# Patient Record
Sex: Female | Born: 1935
Health system: Southern US, Community
[De-identification: ages and names within clinical notes are randomized; demographics above are authoritative.]

## PROBLEM LIST (undated history)

## (undated) DIAGNOSIS — J302 Other seasonal allergic rhinitis: Secondary | ICD-10-CM

## (undated) DIAGNOSIS — G459 Transient cerebral ischemic attack, unspecified: Secondary | ICD-10-CM

## (undated) DIAGNOSIS — J449 Chronic obstructive pulmonary disease, unspecified: Secondary | ICD-10-CM

## (undated) DIAGNOSIS — I10 Essential (primary) hypertension: Secondary | ICD-10-CM

## (undated) HISTORY — PX: NO PAST SURGERIES: SHX2092

---

## 1999-11-25 ENCOUNTER — Inpatient Hospital Stay (HOSPITAL_COMMUNITY): Admission: EM | Admit: 1999-11-25 | Discharge: 1999-11-28 | Payer: Self-pay | Admitting: Emergency Medicine

## 1999-11-25 ENCOUNTER — Encounter: Payer: Self-pay | Admitting: Neurology

## 1999-11-25 ENCOUNTER — Encounter: Payer: Self-pay | Admitting: Emergency Medicine

## 2002-03-05 ENCOUNTER — Ambulatory Visit (HOSPITAL_COMMUNITY): Admission: RE | Admit: 2002-03-05 | Discharge: 2002-03-05 | Payer: Self-pay | Admitting: Family Medicine

## 2002-03-05 ENCOUNTER — Encounter: Payer: Self-pay | Admitting: Family Medicine

## 2002-06-03 ENCOUNTER — Other Ambulatory Visit: Admission: RE | Admit: 2002-06-03 | Discharge: 2002-06-03 | Payer: Self-pay | Admitting: Family Medicine

## 2002-08-01 ENCOUNTER — Other Ambulatory Visit: Admission: RE | Admit: 2002-08-01 | Discharge: 2002-08-01 | Payer: Self-pay | Admitting: Family Medicine

## 2005-08-02 ENCOUNTER — Inpatient Hospital Stay (HOSPITAL_COMMUNITY): Admission: AD | Admit: 2005-08-02 | Discharge: 2005-08-04 | Payer: Self-pay | Admitting: Internal Medicine

## 2005-08-02 ENCOUNTER — Encounter (INDEPENDENT_AMBULATORY_CARE_PROVIDER_SITE_OTHER): Payer: Self-pay | Admitting: *Deleted

## 2005-08-02 ENCOUNTER — Encounter: Payer: Self-pay | Admitting: *Deleted

## 2008-12-16 ENCOUNTER — Emergency Department (HOSPITAL_COMMUNITY): Admission: EM | Admit: 2008-12-16 | Discharge: 2008-12-17 | Payer: Self-pay | Admitting: Emergency Medicine

## 2008-12-17 ENCOUNTER — Observation Stay (HOSPITAL_COMMUNITY): Admission: EM | Admit: 2008-12-17 | Discharge: 2008-12-18 | Payer: Self-pay | Admitting: Emergency Medicine

## 2008-12-18 ENCOUNTER — Encounter: Payer: Self-pay | Admitting: Pulmonary Disease

## 2008-12-23 ENCOUNTER — Encounter: Payer: Self-pay | Admitting: Pulmonary Disease

## 2008-12-23 ENCOUNTER — Ambulatory Visit: Payer: Self-pay | Admitting: Pulmonary Disease

## 2008-12-23 ENCOUNTER — Telehealth: Payer: Self-pay | Admitting: Pulmonary Disease

## 2008-12-23 DIAGNOSIS — I10 Essential (primary) hypertension: Secondary | ICD-10-CM

## 2008-12-23 DIAGNOSIS — J449 Chronic obstructive pulmonary disease, unspecified: Secondary | ICD-10-CM

## 2008-12-23 DIAGNOSIS — R0602 Shortness of breath: Secondary | ICD-10-CM

## 2008-12-23 DIAGNOSIS — J4489 Other specified chronic obstructive pulmonary disease: Secondary | ICD-10-CM | POA: Insufficient documentation

## 2008-12-25 ENCOUNTER — Telehealth (INDEPENDENT_AMBULATORY_CARE_PROVIDER_SITE_OTHER): Payer: Self-pay | Admitting: *Deleted

## 2009-01-14 ENCOUNTER — Ambulatory Visit: Payer: Self-pay | Admitting: Pulmonary Disease

## 2010-11-22 LAB — PROTIME-INR: INR: 1 (ref 0.00–1.49)

## 2010-11-22 LAB — POCT CARDIAC MARKERS
Troponin i, poc: <0.05 ng/mL (ref 0.00–0.09)
Troponin i, poc: <0.05 ng/mL (ref 0.00–0.09)

## 2010-11-22 LAB — DIFFERENTIAL
Basophils Absolute: 0.1 10*3/uL (ref 0.0–0.1)
Basophils Relative: 1 % (ref 0–1)
Eosinophils Relative: 1 % (ref 0–5)
Lymphocytes Relative: 6 % — ABNORMAL LOW (ref 12–46)
Lymphs Abs: 0.5 10*3/uL — ABNORMAL LOW (ref 0.7–4.0)
Monocytes Absolute: 0.3 10*3/uL (ref 0.1–1.0)
Monocytes Absolute: 0.5 10*3/uL (ref 0.1–1.0)
Monocytes Relative: 5 % (ref 3–12)
Neutro Abs: 5.9 10*3/uL (ref 1.7–7.7)
Neutro Abs: 6.3 10*3/uL (ref 1.7–7.7)
Neutrophils Relative %: 74 % (ref 43–77)

## 2010-11-22 LAB — POCT I-STAT, CHEM 8
Calcium, Ion: 1 mmol/L — ABNORMAL LOW (ref 1.12–1.32)
Calcium, Ion: 1.13 mmol/L (ref 1.12–1.32)
Chloride: 102 mEq/L (ref 96–112)
Chloride: 103 mEq/L (ref 96–112)
Creatinine, Ser: 0.8 mg/dL (ref 0.4–1.2)
Glucose, Bld: 130 mg/dL — ABNORMAL HIGH (ref 70–99)
HCT: 49 % — ABNORMAL HIGH (ref 36.0–46.0)
HCT: 51 % — ABNORMAL HIGH (ref 36.0–46.0)
Hemoglobin: 17.3 g/dL — ABNORMAL HIGH (ref 12.0–15.0)
Potassium: 3.4 mEq/L — ABNORMAL LOW (ref 3.5–5.1)

## 2010-11-22 LAB — CBC
HCT: 42.5 % (ref 36.0–46.0)
Hemoglobin: 14.5 g/dL (ref 12.0–15.0)
MCHC: 34.7 g/dL (ref 30.0–36.0)
RBC: 4.8 MIL/uL (ref 3.87–5.11)
RDW: 13.8 % (ref 11.5–15.5)
WBC: 7.2 10*3/uL (ref 4.0–10.5)

## 2010-11-22 LAB — APTT: aPTT: 31 seconds (ref 24–37)

## 2010-12-27 NOTE — H&P (Signed)
NAMELILLIANAH, SWARTZENTRUBER                ACCOUNT NO.:  [id number]   MEDICAL RECORD NO.:  [id number]          PATIENT TYPE:  INP   LOCATION:  6731                         FACILITY:  [facility]   PHYSICIAN:  Lonia Blood, M.D.       DATE OF BIRTH:  08/17/1931   DATE OF ADMISSION:  12/17/2008  DATE OF DISCHARGE:                              HISTORY & PHYSICAL   PRIMARY CARE PHYSICIAN:  Dr. August Saucer L. Clovis Riley.   CHIEF COMPLAINT:  Shortness of breath.   HISTORY OF PRESENT ILLNESS:  Ms. Thunder is a 77 year old woman with  history of hypertension who reports that she came in the emergency room  today after she started having difficulty breathing and felt like she  was suffocating at home.  She feels that the source of her problems is  the exposure to the pesticide that has been sprayed on the fields around  her house.  She gives a childhood history of a hay fever, and throughout  her life, she reports being very sensitive to perfumes or chemicals  which usually cause her to become short of breath.  She has never smoked  cigarettes and does not carry a diagnosis of COPD or asthma.  The  patient presented to the emergency room the night before and it was  thought that she was suffering from pneumonia, and she received an  antibiotic.  She went home and took the antibiotic and took Coreg for  hypertension and promptly got more short of breath and required to come  back to the hospital.  In the emergency room, the patient received  intravenous labetalol and became hypotensive and felt really weak.  After a bolus of intravenous fluids, she recovered and currently, she  does not feel short of breath or does not report cough or sputum  production.  The patient has never had pulmonary function testing that  she can remember.  Ms. 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   PAST MEDICAL HISTORY:  White coat  hypertension.   HOME MEDICATIONS:  The patient has a few blood pressure medication that  she stores in the house and uses these on a p.r.n. basis based on which  one she feels will give her less side effects.  The last one she has  been prescribed and encouraged to use on a daily basis, was diltiazem,  but last night, the patient used Coreg.  The patient also takes aspirin  every day.   SOCIAL HISTORY:  The patient [place]  She never  smoked cigarettes, does not drink alcohol, does not use any illicit  drugs.   FAMILY HISTORY:  The patient has a son with asthma.   REVIEW OF SYSTEMS:  As per HPI.  Also, the patient reports some  occasional urticaria, some joint pains.  No focal weakness or numbness.  No abdominal pain, nausea, or vomiting.  All other systems reviewed and  are per HPI.  PHYSICAL EXAMINATION:  VITAL SIGNS:  On admission, temperature 97.7;  heart rate 97; respiratory rate 18; blood pressure initially 202/99,  subsequently 89/47, and currently 131/59.  GENERAL:  The patient is alert, oriented, in no acute distress, speaks  in full sentence without obvious dyspnea.  HEENT:  The patient's head seems normocephalic, atraumatic.  Eyes,  pupils equal, round, and reactive to light and accommodation.  Extraocular movements intact.  Throat clear.  NECK:  Supple.  No JVD.  CHEST:  Barrel shaped without audible wheezes or rhonchi.  The patient  has prolonged expiration when she tries to exhale deeply.  HEART:  Regular without murmurs, rubs, or gallops.  ABDOMEN:  Soft, nontender, bowel sounds are present.  LOWER EXTREMITIES:  Without edema.  SKIN:  Warm, dry without any suspicious-looking rashes.  NEUROLOGIC:  Cranial nerves II through XII intact.  Strength intact.   LABORATORY VALUES:  At the time of admission, white blood cell count  7.2, hemoglobin 14.5, platelet count is 150.  Chest x-ray shows COPD.  Ventilation-perfusion scan has intermediate  probability.   ASSESSMENT AND PLAN:  This is a 77 year old woman coming in with  probably mild chronic obstructive pulmonary disease exacerbation.  It  seems that upon removal of the patient from the area where the trigger  was in, her respiratory symptoms have improved.  Ms. Boardman is very  apprehensive about using steroids or bronchodilators.  Our plan is to  observe Ms. Mccallum in-house.  If she gets symptomatic during her  hospital stay, we will initiate treatment with Albuterol MDIs and  steroids.  Otherwise, I will perform pulmonary function testing in the  morning and decide on the treatment based on the results of those.  Ms.  Goodgame had a positive D-dimer, and she underwent ventilation-perfusion  scan, which was reported intermediate probability.  Now based on the  patient's history and probable emphysema/chronic obstructive pulmonary  disease, I do not feel that she warrants anticoagulation at this point  in time.  Her oxygen saturation has been 96% without oxygen.  We will  closely monitor the patient.  If she becomes more symptomatic, we could  consider performing a CT scan of the chest with intravenous contrast to  further investigate possibility of pulmonary emboli.      Lonia Blood, M.D.  Electronically Signed     SL/MEDQ  D:  12/17/2008  T:  12/18/2008  Job:  [id number]   cc:   L. Lupe Carney, M.D.

## 2010-12-30 NOTE — H&P (Signed)
NAMEERABELLA, KUIPERS                ACCOUNT NO.:  [id number]   MEDICAL RECORD NO.:  [id number]          PATIENT TYPE:  EMS   LOCATION:  ED                           FACILITY:  [facility]   PHYSICIAN:  Kela Millin, M.D.DATE OF BIRTH:  [birth year]   DATE OF ADMISSION:  08/02/2005  DATE OF DISCHARGE:                                HISTORY & PHYSICAL   PRIMARY CARE PHYSICIAN:  Dr. Clovis Riley.   CHIEF COMPLAINT:  Dizziness and right sided numbness.   HISTORY OF PRESENT ILLNESS:  The patient is a 77 year old white female with  past medical history significant for TIA, hypertension, who presents with  above complaints.  The patient states that she was in her usual state of  health until about 8 p.m. on the night prior to presentation, when she began  feeling dizzy while lying down.  She states that she just felt very sick  like she could fall, and she is unsure about spinning because she has been  keeping her eyes closed.  Shortly thereafter, she also became nauseated and  vomited.  She admits to right sided numbness excluding her face.  She denies  weakness.  The patient also denies palpitations, chest pain, dysphagia,  headaches.  Per family, the patient did not have any slurred speech or  facial droop.  She also denies fevers, cough, dysuria, melena, hematochezia.   The patient was seen in the ER and a CT scan showed a question mark area of  lacunar infarct but a followup MRI showed no acute intracranial findings  noting that there was a remote lacunar infarct.  The patient is admitted to  the [facility] service for further evaluation and management.   PAST MEDICAL HISTORY:  As stated above.   MEDICATIONS:  Micardis 40 mg p.o. every day.   ALLERGIES:  1.  ASPIRIN.  2.  SPORANOX.  3.  TETANUS TOXOID.   SOCIAL HISTORY:  She denies tobacco, also denies alcohol.   FAMILY HISTORY:  Noncontributory.   PHYSICAL EXAMINATION:  GENERAL:  The patient is an elderly white female,  keeping eyes closed throughout the interview, in no respiratory distress.  VITAL SIGNS:  Her temperature is 96.5, blood pressure 135/80 was initially  181/97, pulse 91, respiratory rate 16, O2 sat 96%.  HEENT:  PERRL.  EOMI.  Moist mucous membranes.  No oral exudates.  No facial  asymmetry.  NECK:  Supple.  No adenopathy.  No carotid bruits appreciated.  No  thyromegaly.  LUNGS:  Clear to auscultation bilaterally.  No crackles or wheezes.  CARDIOVASCULAR:  Regular rate and rhythm.  Normal S1 S2.  ABDOMEN:  Soft.  Bowel sounds present.  Nontender, nondistended.  No  organomegaly.  EXTREMITIES:  No cyanosis and no edema.  NEUROLOGIC:  Awake, oriented x3.  Cranial nerves II-XII grossly intact.  Strength 4 to 5 out of 5 and symmetric.  Sensory grossly intact.  No  pronator drift.   LABORATORY DATA:  The CT scan of the head question mark area of lacunar  infarct.  MRI with no acute intracranial findings, chronic microvascular  ischemic  white matter changes.  Remote lacunar infarct.  A chest x-ray with  mild bronchitic changes.   The white cell count is 9.5, hemoglobin is 15.4, hematocrit is 45.4%,  platelet count 181, neutrophil count is 92%.  Sodium 133, potassium 3.6,  chloride 104, CO2 is 25, glucose 169, BUN 12, creatinine 0.7.  LFTs are  within normal limits.  Urinalysis is negative for infection and the INR is  0.9.   ASSESSMENT/PLAN:  1.  Question vertigo, probable transient ischemic attack.  The patient also      with right sided numbness and paresthesias as already discussed and a      previous history of transient ischemic attack and hypertension.  MRI as      above with no acute intracranial findings.  We will start the patient on      Plavix, noted patient allergic to aspirin.  We will obtain carotid      Doppler studies and also a 2D echocardiogram.  Symptomatic relief with      Antivert and antiemetics as appropriate.  Follow and consult neurology,      the patient seen in  the past by Dr. Anne Hahn.  We will also obtain a      fasting lipid profile.  2.  Hypertension.  Monitor blood pressure.  Continue Micardis.           ______________________________  Kela Millin, M.D.     ACV/MEDQ  D:  08/02/2005  T:  08/02/2005  Job:  [id number]   cc:   Marlan Palau, M.D.  Fax: [phone]   Clovis Riley, Dr.  [facility]

## 2010-12-30 NOTE — Discharge Summary (Signed)
[place]. [facility]  Patient:    Erica Arnold, Erica Arnold                      MRN: [id number] Adm. Date:  11/25/99 Disc. Date: 11/28/99 Attending:  Marlan Palau, M.D. CC:         [facility]                           Discharge Summary  ADMITTING DIAGNOSIS:  Probable transient ischemic attack event with left hemisensory deficit.  DISCHARGE DIAGNOSES: 1. Probable transient ischemic attack with left hemisensory deficit. 2. History of hypertension, untreated.  PROCEDURES: 1. MRI scan of the brain. 2. MRI angiogram. 3. Two-dimensional echocardiogram. 4. Carotid Doppler study. 5. CT scan of the brain.  COMPLICATIONS OF ABOVE PROCEDURES:  None.  HISTORY OF PRESENT ILLNESS:  Erica Arnold is a 77 year old right-handed white female born Jul 15, 1932, with a history that is relatively unremarkable.  This patient was brought into the emergency room at [facility] with three separate episodes of left-sided hemisensory deficit.  The first event occurred about 3 oclock p.m. on the day of admission with some numbness of the left hand, forearm, and left face lasting about 15 minutes. 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  Patient had two other episodes later that day involving the left face, arm, and leg with numbness and feeling "weak" all over.  Patient was brought to the hospital for an evaluation.  A CT scan of the brain in the emergency room was unremarkable.  Patient was admitted for evaluation of a probable TIA event.  PAST MEDICAL HISTORY: 1. Left-sided hemisensory deficit x 3 on the day of admission. 2. Possible hypertension is untreated.  Patient was on no medications prior to    admission.  ALLERGIES:  She states an allergy/intolerance to ASPIRIN.  She is allergic to [facility] which causes a rash.  Please refer to history and physical dictation  summary for social history, family history, review of systems, and physical examination.  LABORATORY VALUES:  White count 8.5, hemoglobin 15.3, hematocrit 42.5, MCV 85.1, platelets 166, sed rate 8, INR 1.0.  Sodium 143, potassium 3.8, chloride 108, glucose 117, BUN 12, creatinine 0.5.  Homocysteine level 6.4 which is normal.  EKG reveals normal sinus rhythm with sinus tachycardia, heart rate of 106.  Chest x-ray shows chronic lung disease, no acute abnormality.  HOSPITAL COURSE:  Patient has done well during the course of hospitalization. Patient was admitted to the hospital and IV heparin was started.  Patient was noted to have a significant problem with hypertension with initial blood pressure of 190/101, heart rate of 106.  Patient was not initially treated with antihypertensive medications but heparin again was started.   The patient was set up for a carotid Doppler study that shows evidence of a right internal carotid artery stenosis of 40 to 60% with likelihood of the low range being present due to tortuosity of the vessel, no internal carotid artery stenosis noted on the left.  Antegrade vertebral artery flow was noted.  A 2D echocardiogram was performed showing normal left ventricular size and ejection fraction.  Left and right atria were of normal size.  Right ventricle was of normal size.  Aortic root was normal.  Pulmonary artery was normal.  No pericardial effusion was noted.  Mild aortic  sclerosis was present.  Mild mitral annular calcification was present.  Patient underwent an MRI scan of the brain showing mild small vessel disease.  MRI angiogram shows no acute findings.  Patient has mild atherosclerotic changes with a proximal internal carotid artery in the right, a diminished signal in the right internal carotid artery, and the left was present likely due to artifact, not actual.  Patient was taken off of heparin and was discharged to home, taking baby aspirin a day.   Patient was to follow up with Dr. Anne Hahn within three weeks following discharge.  At the time of discharge, patient was bright, alert, cooperative, no focal neurologic deficits noted, fully ambulatory.  Blood pressures during this hospitalization remained somewhat borderline ranging from 150 to 175 systolic and around 75 to 100 diastolic.  These blood pressures are to be followed and may require medical management in the future.  Low-salt diet is recommended. DD:  12/15/99 TD:  12/17/99 Job: [id number]

## 2010-12-30 NOTE — Consult Note (Signed)
Erica Arnold, Erica Arnold                ACCOUNT NO.:  [id number]   MEDICAL RECORD NO.:  [id number]          PATIENT TYPE:  INP   LOCATION:  3713                         FACILITY:  [facility]   PHYSICIAN:  Deanna Artis. Hickling, M.D.DATE OF BIRTH:  [birth year]   DATE OF CONSULTATION:  DATE OF DISCHARGE:  08/04/2005                                   CONSULTATION   SUMMARIZATION OF HOSPITALIZATION:  The patient is a 77 year old woman who  presented with onset of severe vertigo, nausea, and right-sided weakness.   The patient was admitted to the hospital by the Hospitalist Service.  The  decision was that the patient had a stroke despite the fact that the initial  MRI scan was negative.   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   Right vertebral artery was dominant.  There is significant intracranial  atherosclerotic disease in the posterior circulation otherwise no  significant carotid disease.   Carotid Doppler showed no hemodynamically significant stenosis with mild  plaque throughout the common carotid bifurcation internal carotid arteries  bilateral antegrade flow, there was evidence of right internal carotid  artery stenosis.   2D echocardiogram showed left ventricular ejection fraction of 65-75%, the  study was inadequate to determine regional wall motion, there was no  significant valvular disease, there was no source of emboli.  The patient  passed her swallowing study on my request.   Serum homocystine elevated at 16.7, total cholesterol 241, HDL 37, VLDL 47,  LDL 159, triglycerides 229, hemoglobin A1c 5.6.   Her examination today -- the patient was awake and alert, cranial nerves  were intact.  She had some left lateral gaze nystagmus, but I did not see  that in the central position.  She  had symmetric facial strength, no sensory  loss or field cut.  Motor examination showed excellent strength, normal  finger-to-nose and fine motor movements.  Sensation no hemisensory,  cerebellar no hemi dystaxia.  Reflexes were diminished.  Gait was normal.   The patient is being discharged on:  1.  Enteric-coated aspirin 81 mg daily.  She says that she cannot tolerate      higher doses than that.  2.  Also on Metanx one per day.  3.  Lipitor 20 mg per day.   Her home medications include:  1.  Micardis 40 mg in the afternoon.  2.  Protonix 40 mg per day.  3.  Plavix 75 mg per day.   The patient is discharged in improved condition.  She will follow-up in the  neurology office with myself, with Dr. Jacki Cones, and our nurse  practitioner, Annie Main in two months time.      Deanna Artis. Sharene Skeans, M.D.  Electronically Signed     [facility]/MEDQ  D:  08/04/2005  T:  08/07/2005  Job:  [id number]   cc:   Pramod P. Pearlean Brownie, MD  Fax: [phone]

## 2010-12-30 NOTE — H&P (Signed)
[place]. [facility]  Patient:    Erica Arnold, Erica Arnold                       MRN: [id number] Adm. Date:  [id number] Attending:  Lesly Dukes CC:         [facility], [address]                         History and Physical  HISTORY OF PRESENT ILLNESS:  Donta Mcinroy. Ishibashi is a 77 year old, right-handed, white female born 04-10-1932, who has essentially a negative past medical history. The patient comes to the [facility] Emergency Room following three separate episodes of left-sided hemisensory deficit.  The first event occurred around 3 oclock this afternoon, associated with some numbness of the left hand and forearm and left face, lasting about 15 minutes.  The patient thought initially  that this was secondary to herbicide they were putting down on tobacco fields today.  The patient noted clearing initially, but then began having similar problems again and felt as if both feet had gotten numb, and left leg and left rm were numb.  This once again cleared.  The patient took a shower and laid down to rest and did well.  Two hours later, the patient again noted left face, arm, and leg numbness.  The patient felt "weak all over" and noted that it was a bit more difficult to speak than usual.  The patient claims this episode lasted about 30  minutes.  The patient did note full clearing about the time she got to the emergency room.  The patient denies any visual field changes, denies headache, blackout episodes, focal weakness per se.  The patient is coming to the hospital at this point for evaluation of possible TIA event.  CT scan of the brain done through the emergency room was unremarkable.  PAST MEDICAL HISTORY:  1. Episodes of left hemisensory deficit x 3 on the day of admission.  2. Possible hypertension, untreated.  MEDICATIONS:  The patient is on no medications.  ALLERGIES:  States intolerance to ASPIRIN and  allergic to [facility], which causes a skin rash.  SOCIAL HISTORY:  The patient is married.  Lives in the [place] area.  Has three children who are alive and well.  FAMILY MEDICAL HISTORY:  Notable that the mother has died with a stroke or pulmonary embolus.  Father died with cancer of the prostate.  The patient had six brothers, three sisters.  One brother died following a motor vehicle accident. One sister died with a parathyroid problem, hypertension, MI.  Sister died age 50 with sepsis.  Another sister died with stroke.  The patient has a brother with heart  disease, a brother with diabetes.  A brother died with polycythemia and stroke, and brother with heart disease died.  REVIEW OF SYSTEMS:  Notable for no fever or chills.  The patient denies headache, shortness of breath.  Denies chest pains.  Denies problems controlling the bowels or the bladder.  Denies any prior history of stroke, blackout episodes, dizziness.  PHYSICAL EXAMINATION:  VITAL SIGNS:  Blood pressure initially is 190/101, heart rate 106.  GENERAL:  This patient is a minimally obese white female who is alert and cooperative at the time of examination.  HEENT:  Head is atraumatic.  Eyes:  Pupils are equal, round, and react to light. Disks:  Flat bilaterally.  Extraocular movements are full.  NECK:  Supple.  No carotid bruits noted.  RESPIRATORY:  Clear to auscultation and percussion.  CARDIOVASCULAR:  Regular rate and rhythm without obvious murmurs or rubs.  ABDOMEN:  Positive bowel sounds.  No organomegaly or tenderness is noted.  EXTREMITIES:  Without significant edema.  NEUROLOGIC:  Cranial nerves as above.  Facial asymmetry is present.  The patient has a good sensation of face to pinprick and soft touch bilaterally.  The patient has good strength of facial muscles and the muscles of head turning, shoulder shrug bilaterally.  Speech is well enunciated and not aphasic.  Again, visual  fields re full.  Motor testing reveals 5/5 strength in all fours.  Good symmetric motor tones noted throughout.  Sensory testing is intact to pinprick, soft touch, vibratory  sensation throughout.  Finger-to-nose-to-finger, toe-to-finger symmetric, normal. Gait was not tested.  Deep tendon reflexes are symmetric and normal.  Toes are downgoing bilaterally.  LABORATORY VALUES:  Notable for glucose of 117, BUN 12, sodium 143, potassium 3.8, chloride 108, CO2 28.  Hematocrit 45, hemoglobin 15, pH 7.432, creatinine 0.5.  IMPRESSION:  Probable transient ischemic attack event, left hemisensory deficit.  This patient has undergone a CT scan of the brain.  It is unremarkable.  The patient has had three separate episodes of left-sided numbness.  At least one event may have been associated with numbness in both feet.  Need to rule out posterior circulation abnormality or right brain ischemia.  The patient appeared to be neurologically unstable at this point.  PLAN:  1. Would admit patient to unit 3000 for an evaluation of TIA.  2. IV heparin therapy.  3. MRI scan of the brain.  4. MR angiogram.  5. A 2-D echocardiogram.  6. Carotid Doppler study.  7. Will follow clinical course while in house. DD:  11/25/99 TD:  11/25/99 Job: [id number]

## 2010-12-30 NOTE — Consult Note (Signed)
NAMEAMIA, RYNDERS                ACCOUNT NO.:  [id number]   MEDICAL RECORD NO.:  [id number]          PATIENT TYPE:  EMS   LOCATION:  ED                           FACILITY:  [facility]   PHYSICIAN:  Melvyn Novas, M.D.  DATE OF BIRTH:  09/08/1931   DATE OF CONSULTATION:  08/02/2005  DATE OF DISCHARGE:                                   CONSULTATION   Admitted to [facility] ER on August 02, 2005 to the service of  Dr. Donnalee Curry, hospitalist.  Consult called in by Dr. Suanne Marker.   CHIEF COMPLAINT:  Vertigo and right sided numbness.   HISTORY OF PRESENT ILLNESS:  Mrs. Lafuente is a 77 year old right handed  Caucasian female who presented to the ER with a chief complaint of right  sided numbness, no weakness.  She stated to Dr. Suanne Marker, after she was  evaluated this morning, that she noticed severe vertigo with nausea,  counterclockwise vertigo and the patient still felt right sided numbness  which has been a little bit decreased in comparison to the original  complaint time.  Dr. Suanne Marker called Neurology this morning at 4 a.m. and Mrs.  Cedrone states that since she has been in the ER now for 14 hours, she has  had almost resolution of her sensory findings, but her vertigo has gotten  worse.  She denies any trigger factors.  She awoke with the deficits earlier  this morning.  She has no recent illness, viral, or otherwise of nature.  No  recent medication changes.   REVIEW OF SYSTEMS:  Nausea, vertigo, hemi-numbness, balance problems,  anorexia, generalized weakness.   PAST MEDICAL HISTORY:  1.  Lacunar infarct at 2001.  Seen by Dr. Anne Hahn who had seen the patient at      that time with complaint of right sided symptoms, hemi-weakness and      numbness.  2.  History of hypertension which has been poorly controlled on Micardis.      The patient has been unaware of elevated blood glucose levels that were      found today.  3.  She is likely a diabetic type 2.   LABORATORY  DATA:  Glucosuria, elevated serum glucose, LEH.  EKG was normal.  Sodium 130, potassium 3.6, chloride 104, BUN 12, creatinine 0.7, glucose  169, normal GOT, GPT and calcium levels.  MRI shows no acute stroke.  Earlier CT showed an old lacune in the pontine region.   MEDICATIONS:  The only medication at home is Micardis 40 mg.  The patient  says she has not been on aspirin.  She has no allergy to aspirin.  She  started today here in the ER on Plavix 75 mg, Protonix 40 mg, Lovenox subcu.  She also received Antivert x2 and Zofran with little results.   ALLERGIES:  Tetanus shots.   REVIEW OF SYSTEMS:  As stated above.  Out of 15 system review, endorsed for  mainly vertigo and hemi-numbness.   PHYSICAL EXAMINATION:  VITAL SIGNS:  Blood pressure 179/97, heart rate 94,  temperature 96.5, respiratory rate 18, oxygen saturation 96.  LUNGS:  Clear to auscultation.  COR:  Regular rate and rhythm.  No bruits over the carotid.  Peripheral  pulses are positive.  EXTREMITIES:  She has a slightly modeled and puffy looking skin, but does  not show edema.  No bruising or rash.  No goiter.  No neck vein distention,  no lymph node swelling.  Upper airway shows no inflammatory changes.  She  has anicteric sclerae.  NEUROLOGICAL:  Mental status alert and oriented x3.  Discomfort is visible  when the patient opens her eyes.  She feels much better with her eyes  closed.  We could have evaluated her for severe nystagmus in either gaze  direction horizontally.  No vertical nystagmus. Facial symmetry.  No tongue  or uvula deviation.  No facial sensory loss.  The neck is supple.  Motor  examination shows 5/5 equal strength, tone and mass.  Anti-gravity  movements.  She follows motor commands without hesitation.  Deep tendon  reflexes are symmetric at 1+.  Downgoing toes to plantar stimulation.  Sensory:  The patient feels still numb in the right arm, but to fine touch,  she does not seem to feel impaired.  It  is more an alteration/ dysaesthesia  as a primary sensory loss.  Coordination:  Fingers and nose intact on the  right, but interestingly, there is mild dysmetria on the left without tremor  or ataxia.   ASSESSMENT:  This is a vertigo case.  MRI was negative for stroke.  The  patient does have multiple risk factors for cerebrovascular accident  including newly diagnosed diabetes here from the ER today, hypertension and  cholesterol levels.  We need to try to control these with risk factors  during her hospitalization.  Unable to try to suggest to treat the vertigo with valium and Thorazine.  The patient will be transferred to a telemetry bed at [facility]  under the service of Dr. Suanne Marker.  I also suggest that we do a transcranial  Doppler.      Melvyn Novas, M.D.  Electronically Signed     CD/MEDQ  D:  08/02/2005  T:  08/05/2005  Job:  [id number]

## 2011-09-19 DIAGNOSIS — I10 Essential (primary) hypertension: Secondary | ICD-10-CM | POA: Diagnosis not present

## 2011-09-29 DIAGNOSIS — J309 Allergic rhinitis, unspecified: Secondary | ICD-10-CM | POA: Diagnosis not present

## 2011-09-29 DIAGNOSIS — E7800 Pure hypercholesterolemia, unspecified: Secondary | ICD-10-CM | POA: Diagnosis not present

## 2011-09-29 DIAGNOSIS — I10 Essential (primary) hypertension: Secondary | ICD-10-CM | POA: Diagnosis not present

## 2012-03-28 DIAGNOSIS — I10 Essential (primary) hypertension: Secondary | ICD-10-CM | POA: Diagnosis not present

## 2012-03-28 DIAGNOSIS — R002 Palpitations: Secondary | ICD-10-CM | POA: Diagnosis not present

## 2012-03-28 DIAGNOSIS — E7800 Pure hypercholesterolemia, unspecified: Secondary | ICD-10-CM | POA: Diagnosis not present

## 2012-03-28 DIAGNOSIS — R5383 Other fatigue: Secondary | ICD-10-CM | POA: Diagnosis not present

## 2012-05-30 DIAGNOSIS — L570 Actinic keratosis: Secondary | ICD-10-CM | POA: Diagnosis not present

## 2012-05-30 DIAGNOSIS — Z85828 Personal history of other malignant neoplasm of skin: Secondary | ICD-10-CM | POA: Diagnosis not present

## 2012-05-30 DIAGNOSIS — D044 Carcinoma in situ of skin of scalp and neck: Secondary | ICD-10-CM | POA: Diagnosis not present

## 2012-05-30 DIAGNOSIS — D235 Other benign neoplasm of skin of trunk: Secondary | ICD-10-CM | POA: Diagnosis not present

## 2012-10-14 DIAGNOSIS — R002 Palpitations: Secondary | ICD-10-CM | POA: Diagnosis not present

## 2012-11-02 DIAGNOSIS — R002 Palpitations: Secondary | ICD-10-CM | POA: Diagnosis not present

## 2013-08-25 DIAGNOSIS — Z85828 Personal history of other malignant neoplasm of skin: Secondary | ICD-10-CM | POA: Diagnosis not present

## 2013-08-25 DIAGNOSIS — D235 Other benign neoplasm of skin of trunk: Secondary | ICD-10-CM | POA: Diagnosis not present

## 2013-08-25 DIAGNOSIS — C44319 Basal cell carcinoma of skin of other parts of face: Secondary | ICD-10-CM | POA: Diagnosis not present

## 2013-10-23 DIAGNOSIS — Z85828 Personal history of other malignant neoplasm of skin: Secondary | ICD-10-CM | POA: Diagnosis not present

## 2013-10-23 DIAGNOSIS — L259 Unspecified contact dermatitis, unspecified cause: Secondary | ICD-10-CM | POA: Diagnosis not present

## 2013-10-23 DIAGNOSIS — L570 Actinic keratosis: Secondary | ICD-10-CM | POA: Diagnosis not present

## 2014-11-13 DIAGNOSIS — R634 Abnormal weight loss: Secondary | ICD-10-CM | POA: Diagnosis not present

## 2014-11-13 DIAGNOSIS — I10 Essential (primary) hypertension: Secondary | ICD-10-CM | POA: Diagnosis not present

## 2014-11-13 DIAGNOSIS — N3000 Acute cystitis without hematuria: Secondary | ICD-10-CM | POA: Diagnosis not present

## 2014-11-13 DIAGNOSIS — R110 Nausea: Secondary | ICD-10-CM | POA: Diagnosis not present

## 2015-01-10 ENCOUNTER — Encounter (HOSPITAL_COMMUNITY): Payer: Self-pay | Admitting: Emergency Medicine

## 2015-01-10 ENCOUNTER — Emergency Department (HOSPITAL_COMMUNITY)
Admission: EM | Admit: 2015-01-10 | Discharge: 2015-01-10 | Disposition: A | Discharge status: Home Health | Payer: Medicare Other | Attending: Emergency Medicine | Admitting: Emergency Medicine

## 2015-01-10 DIAGNOSIS — Y939 Activity, unspecified: Secondary | ICD-10-CM | POA: Diagnosis not present

## 2015-01-10 DIAGNOSIS — Z8673 Personal history of transient ischemic attack (TIA), and cerebral infarction without residual deficits: Secondary | ICD-10-CM | POA: Diagnosis not present

## 2015-01-10 DIAGNOSIS — Y999 Unspecified external cause status: Secondary | ICD-10-CM | POA: Insufficient documentation

## 2015-01-10 DIAGNOSIS — S70261A Insect bite (nonvenomous), right hip, initial encounter: Secondary | ICD-10-CM | POA: Insufficient documentation

## 2015-01-10 DIAGNOSIS — W57XXXA Bitten or stung by nonvenomous insect and other nonvenomous arthropods, initial encounter: Secondary | ICD-10-CM | POA: Insufficient documentation

## 2015-01-10 DIAGNOSIS — Y929 Unspecified place or not applicable: Secondary | ICD-10-CM | POA: Insufficient documentation

## 2015-01-10 HISTORY — DX: Other seasonal allergic rhinitis: J30.2

## 2015-01-10 HISTORY — DX: Transient cerebral ischemic attack, unspecified: G45.9

## 2015-01-10 MED ORDER — CEPHALEXIN 500 MG PO CAPS: 500.0000 mg | ORAL_CAPSULE | Freq: Two times a day (BID) | ORAL | Status: DC

## 2015-01-10 NOTE — ED Notes (Signed)
Small FB removed by provider, pt tolerated well

## 2015-01-10 NOTE — ED Notes (Signed)
Pt states she noticed a tick on R hip area on 5/25.  She has only been able to remove pieces of it and says that part of it is under her skin.

## 2015-01-10 NOTE — ED Notes (Signed)
MD at bedside. 

## 2015-01-10 NOTE — ED Notes (Signed)
abx ointment and bandage applied to wound

## 2015-01-10 NOTE — ED Provider Notes (Signed)
CSN: [id number]     Arrival date & time 01/10/15  1950 History  This chart was scribed for Noland Fordyce, PA-C, working with Lacretia Leigh, MD by Marti Sleigh, ED Scribe. This patient was seen in room TR10C/TR10C and the patient's care was started at 8:52 PM.      Chief Complaint  Patient presents with  . Tick Removal   The history is provided by the patient. No language interpreter was used.    HPI Comments: Erica Arnold is a 81 y.o. female who presents to the Emergency Department complaining of a tick bite on her right hip that occurred four days ago. Pt states she was unable to fully remove the tick, and the skin around the bite has become red, scabbed and inflamed. Denies pain to the area. Pt denies nausea, vomiting, fever, or other rashes. Pt is allergic to doxycycline, states she stops breathing. Pt states she is not sure how long the tick had been attached at the point that she found, but states it was not swollen when she found it.    Past Medical History  Diagnosis Date  . Seasonal allergies   . TIA (transient ischemic attack)    History reviewed. No pertinent past surgical history. No family history on file. History  Substance Use Topics  . Smoking status: Never Smoker   . Smokeless tobacco: Not on file  . Alcohol Use: No   OB History    No data available     Review of Systems  Constitutional: Negative for fever and chills.  Gastrointestinal: Negative for nausea and vomiting.  Skin: Positive for color change and wound.    Allergies  Doxycycline  Home Medications   Prior to Admission medications   Medication Sig Start Date End Date Taking? Authorizing Provider  cephALEXin (KEFLEX) 500 MG capsule Take 1 capsule (500 mg total) by mouth 2 (two) times daily. For 10 days 01/10/15   Noland Fordyce, PA-C   BP 167/96 mmHg  Pulse 72  Temp(Src) 97.7 F (36.5 C) (Oral)  Resp 18  Ht 5\' 4"  (1.626 m)  Wt 105 lb (47.628 kg)  BMI 18.01 kg/m2  SpO2 98% Physical Exam   Constitutional: She is oriented to person, place, and time. She appears well-developed and well-nourished. No distress.  HENT:  Head: Normocephalic and atraumatic.  Eyes: Pupils are equal, round, and reactive to light.  Neck: Neck supple.  Cardiovascular: Normal rate.   Pulmonary/Chest: Effort normal. No respiratory distress.  Musculoskeletal: Normal range of motion.  Neurological: She is alert and oriented to person, place, and time. Coordination normal.  Skin: Skin is warm and dry. No rash noted. She is not diaphoretic. There is erythema.     Dime sized area of erythema and pinpoint centralized black area, likely foreign body/rest of the tick. Scant red blood. No discharge. No induration or fluctuance.  Non-tender. No surrounding erythema.   Psychiatric: She has a normal mood and affect. Her behavior is normal.  Nursing note and vitals reviewed.   ED Course  FOREIGN BODY REMOVAL Date/Time: 01/10/2015 11:02 PM Performed by: Noland Fordyce Authorized by: Noland Fordyce Consent: Verbal consent obtained. Risks and benefits: risks, benefits and alternatives were discussed Consent given by: patient Patient understanding: patient states understanding of the procedure being performed Patient consent: the patient's understanding of the procedure matches consent given Procedure consent: procedure consent matches procedure scheduled Relevant documents: relevant documents present and verified Test results: test results available and properly labeled Site marked: the operative  site was marked Required items: required blood products, implants, devices, and special equipment available Patient identity confirmed: verbally with patient and arm band Time out: Immediately prior to procedure a "time out" was called to verify the correct patient, procedure, equipment, support staff and site/side marked as required. Body area: skin General location: trunk Location details: abdomen Anesthesia  method: none. Patient sedated: no Patient restrained: no Localization method: visualized Removal mechanism: irrigation (21gtt needle) Dressing: antibiotic ointment and dressing applied Tendon involvement: none Depth: subcutaneous Complexity: simple 1 objects recovered. Objects recovered: black spec- tick vs dirt/debris Post-procedure assessment: foreign body removed Patient tolerance: Patient tolerated the procedure well with no immediate complications       DIAGNOSTIC STUDIES: Oxygen Saturation is 98% on RA, normal by my interpretation.    COORDINATION OF CARE: 8:58 PM Discussed treatment plan with pt at bedside and pt agreed to plan.  Labs Review Labs Reviewed - No data to display  Imaging Review No results found.   EKG Interpretation None      MDM   Final diagnoses:  Tick bite  Tick bite with subsequent removal of tick   Pt is an 81yo female presenting to ED with c/o a tick biting her Right hip. Pt was able to get majority of tick off over the last 4 days but still reports pinpoint area of black in center of wound made from her attempts of trying to get tick out.  Rest of tick/black spec removed w/o immediate complication. Pt states she had difficulty breathing when given doxycyline last.  Per UpToDate, no alternative for prophylaxis for lyme disease, however, due to irritation to area as pt was taking tick out of skin, will place pt on keflex to help prevent cellulitis.  Strongly encouraged to f/u with PCP in 3-4 days for wound recheck and symptom recheck. Wound care instructions provided. Return precautions provided. Pt verbalized understanding and agreement with tx plan.    I personally performed the services described in this documentation, which was scribed in my presence. The recorded information has been reviewed and is accurate.    Noland Fordyce, PA-C 01/10/15 2306  Lacretia Leigh, MD 01/12/15 [phone]

## 2016-02-14 DIAGNOSIS — E46 Unspecified protein-calorie malnutrition: Secondary | ICD-10-CM | POA: Diagnosis not present

## 2016-02-14 DIAGNOSIS — I10 Essential (primary) hypertension: Secondary | ICD-10-CM | POA: Diagnosis not present

## 2016-02-14 DIAGNOSIS — T753XXA Motion sickness, initial encounter: Secondary | ICD-10-CM | POA: Diagnosis not present

## 2016-02-14 DIAGNOSIS — R0600 Dyspnea, unspecified: Secondary | ICD-10-CM | POA: Diagnosis not present

## 2017-02-26 DIAGNOSIS — E46 Unspecified protein-calorie malnutrition: Secondary | ICD-10-CM | POA: Diagnosis not present

## 2017-02-26 DIAGNOSIS — I10 Essential (primary) hypertension: Secondary | ICD-10-CM | POA: Diagnosis not present

## 2017-02-26 DIAGNOSIS — R0600 Dyspnea, unspecified: Secondary | ICD-10-CM | POA: Diagnosis not present

## 2017-12-26 ENCOUNTER — Inpatient Hospital Stay (HOSPITAL_COMMUNITY)
Admission: EM | Admit: 2017-12-26 | Discharge: 2017-12-31 | DRG: 470 | Disposition: A | Discharge status: Skilled Nursing Facility | Payer: Medicare Other | Attending: Internal Medicine | Admitting: Internal Medicine

## 2017-12-26 ENCOUNTER — Emergency Department (HOSPITAL_COMMUNITY): Payer: Medicare Other

## 2017-12-26 ENCOUNTER — Other Ambulatory Visit: Payer: Self-pay

## 2017-12-26 ENCOUNTER — Encounter (HOSPITAL_COMMUNITY): Payer: Self-pay | Admitting: Emergency Medicine

## 2017-12-26 DIAGNOSIS — Z471 Aftercare following joint replacement surgery: Secondary | ICD-10-CM | POA: Diagnosis not present

## 2017-12-26 DIAGNOSIS — R2241 Localized swelling, mass and lump, right lower limb: Secondary | ICD-10-CM | POA: Diagnosis present

## 2017-12-26 DIAGNOSIS — D62 Acute posthemorrhagic anemia: Secondary | ICD-10-CM | POA: Diagnosis not present

## 2017-12-26 DIAGNOSIS — W010XXA Fall on same level from slipping, tripping and stumbling without subsequent striking against object, initial encounter: Secondary | ICD-10-CM | POA: Diagnosis present

## 2017-12-26 DIAGNOSIS — Z881 Allergy status to other antibiotic agents status: Secondary | ICD-10-CM | POA: Diagnosis not present

## 2017-12-26 DIAGNOSIS — I10 Essential (primary) hypertension: Secondary | ICD-10-CM | POA: Diagnosis not present

## 2017-12-26 DIAGNOSIS — S72001A Fracture of unspecified part of neck of right femur, initial encounter for closed fracture: Secondary | ICD-10-CM

## 2017-12-26 DIAGNOSIS — I119 Hypertensive heart disease without heart failure: Secondary | ICD-10-CM | POA: Diagnosis present

## 2017-12-26 DIAGNOSIS — S72091A Other fracture of head and neck of right femur, initial encounter for closed fracture: Secondary | ICD-10-CM | POA: Diagnosis not present

## 2017-12-26 DIAGNOSIS — J449 Chronic obstructive pulmonary disease, unspecified: Secondary | ICD-10-CM | POA: Diagnosis present

## 2017-12-26 DIAGNOSIS — Z66 Do not resuscitate: Secondary | ICD-10-CM | POA: Diagnosis present

## 2017-12-26 DIAGNOSIS — Z9889 Other specified postprocedural states: Secondary | ICD-10-CM

## 2017-12-26 DIAGNOSIS — R2689 Other abnormalities of gait and mobility: Secondary | ICD-10-CM | POA: Diagnosis not present

## 2017-12-26 DIAGNOSIS — D696 Thrombocytopenia, unspecified: Secondary | ICD-10-CM | POA: Diagnosis not present

## 2017-12-26 DIAGNOSIS — E875 Hyperkalemia: Secondary | ICD-10-CM | POA: Diagnosis not present

## 2017-12-26 DIAGNOSIS — Z8781 Personal history of (healed) traumatic fracture: Secondary | ICD-10-CM

## 2017-12-26 DIAGNOSIS — M2550 Pain in unspecified joint: Secondary | ICD-10-CM | POA: Diagnosis not present

## 2017-12-26 DIAGNOSIS — S72011A Unspecified intracapsular fracture of right femur, initial encounter for closed fracture: Principal | ICD-10-CM | POA: Diagnosis present

## 2017-12-26 DIAGNOSIS — Z8709 Personal history of other diseases of the respiratory system: Secondary | ICD-10-CM | POA: Diagnosis not present

## 2017-12-26 DIAGNOSIS — Z79899 Other long term (current) drug therapy: Secondary | ICD-10-CM

## 2017-12-26 DIAGNOSIS — M62838 Other muscle spasm: Secondary | ICD-10-CM | POA: Diagnosis not present

## 2017-12-26 DIAGNOSIS — Z8673 Personal history of transient ischemic attack (TIA), and cerebral infarction without residual deficits: Secondary | ICD-10-CM

## 2017-12-26 DIAGNOSIS — M898X8 Other specified disorders of bone, other site: Secondary | ICD-10-CM | POA: Diagnosis not present

## 2017-12-26 DIAGNOSIS — G459 Transient cerebral ischemic attack, unspecified: Secondary | ICD-10-CM | POA: Diagnosis not present

## 2017-12-26 DIAGNOSIS — J302 Other seasonal allergic rhinitis: Secondary | ICD-10-CM | POA: Diagnosis present

## 2017-12-26 DIAGNOSIS — Z7401 Bed confinement status: Secondary | ICD-10-CM | POA: Diagnosis not present

## 2017-12-26 DIAGNOSIS — G8911 Acute pain due to trauma: Secondary | ICD-10-CM | POA: Diagnosis not present

## 2017-12-26 DIAGNOSIS — M6281 Muscle weakness (generalized): Secondary | ICD-10-CM | POA: Diagnosis not present

## 2017-12-26 DIAGNOSIS — Z4789 Encounter for other orthopedic aftercare: Secondary | ICD-10-CM | POA: Diagnosis not present

## 2017-12-26 DIAGNOSIS — R52 Pain, unspecified: Secondary | ICD-10-CM | POA: Diagnosis not present

## 2017-12-26 DIAGNOSIS — M25551 Pain in right hip: Secondary | ICD-10-CM | POA: Diagnosis not present

## 2017-12-26 DIAGNOSIS — S299XXA Unspecified injury of thorax, initial encounter: Secondary | ICD-10-CM | POA: Diagnosis not present

## 2017-12-26 HISTORY — DX: Chronic obstructive pulmonary disease, unspecified: J44.9

## 2017-12-26 HISTORY — DX: Essential (primary) hypertension: I10

## 2017-12-26 LAB — CBC WITH DIFFERENTIAL/PLATELET
Abs Immature Granulocytes: 0.1 10*3/uL (ref 0.0–0.1)
BASOS ABS: 0.1 10*3/uL (ref 0.0–0.1)
Basophils Relative: 1 %
Eosinophils Absolute: 0 10*3/uL (ref 0.0–0.7)
Eosinophils Relative: 0 %
HEMATOCRIT: 46.8 % — AB (ref 36.0–46.0)
Hemoglobin: 15.5 g/dL — ABNORMAL HIGH (ref 12.0–15.0)
IMMATURE GRANULOCYTES: 1 %
LYMPHS ABS: 0.5 10*3/uL — AB (ref 0.7–4.0)
LYMPHS PCT: 4 %
MCH: 29.9 pg (ref 26.0–34.0)
MCHC: 33.1 g/dL (ref 30.0–36.0)
MCV: 90.3 fL (ref 78.0–100.0)
Monocytes Absolute: 0.3 10*3/uL (ref 0.1–1.0)
Monocytes Relative: 3 %
NEUTROS PCT: 91 %
Neutro Abs: 10 10*3/uL — ABNORMAL HIGH (ref 1.7–7.7)
Platelets: 124 10*3/uL — ABNORMAL LOW (ref 150–400)
RBC: 5.18 MIL/uL — AB (ref 3.87–5.11)
RDW: 12.8 % (ref 11.5–15.5)
WBC: 10.9 10*3/uL — AB (ref 4.0–10.5)

## 2017-12-26 LAB — PROTIME-INR
INR: 0.96
PROTHROMBIN TIME: 12.6 s (ref 11.4–15.2)

## 2017-12-26 LAB — TYPE AND SCREEN
ABO/RH(D): O POS
Antibody Screen: NEGATIVE

## 2017-12-26 LAB — BASIC METABOLIC PANEL
Anion gap: 11 (ref 5–15)
BUN: 16 mg/dL (ref 6–20)
CHLORIDE: 103 mmol/L (ref 101–111)
CO2: 27 mmol/L (ref 22–32)
CREATININE: 0.75 mg/dL (ref 0.44–1.00)
Calcium: 9.1 mg/dL (ref 8.9–10.3)
GFR calc non Af Amer: >60 mL/min (ref >60)
Glucose, Bld: 120 mg/dL — ABNORMAL HIGH (ref 65–99)
POTASSIUM: 3.7 mmol/L (ref 3.5–5.1)
SODIUM: 141 mmol/L (ref 135–145)

## 2017-12-26 LAB — ABO/RH: ABO/RH(D): O POS

## 2017-12-26 MED ORDER — ACETAMINOPHEN 325 MG PO TABS
650.0000 mg | ORAL_TABLET | ORAL | Status: DC | PRN
  Administered 2017-12-26 – 2017-12-27 (×2): 325 mg via ORAL
  Administered 2017-12-27: 650 mg via ORAL
  Filled 2017-12-26 (×3): qty 2

## 2017-12-26 MED ORDER — POLYETHYLENE GLYCOL 3350 17 G PO PACK: 17.0000 g | PACK | Freq: Every day | ORAL | Status: DC | PRN

## 2017-12-26 MED ORDER — CHLORHEXIDINE GLUCONATE 4 % EX LIQD
60.0000 mL | Freq: Once | CUTANEOUS | Status: AC
  Administered 2017-12-27: 4 via TOPICAL
  Filled 2017-12-26: qty 60

## 2017-12-26 MED ORDER — METHOCARBAMOL 1000 MG/10ML IJ SOLN
500.0000 mg | Freq: Four times a day (QID) | INTRAVENOUS | Status: DC | PRN
  Filled 2017-12-26 (×2): qty 5

## 2017-12-26 MED ORDER — MORPHINE SULFATE (PF) 4 MG/ML IV SOLN
4.0000 mg | Freq: Once | INTRAVENOUS | Status: AC
  Administered 2017-12-26: 4 mg via INTRAVENOUS
  Filled 2017-12-26: qty 1

## 2017-12-26 MED ORDER — METOPROLOL TARTRATE 12.5 MG HALF TABLET
12.5000 mg | ORAL_TABLET | Freq: Two times a day (BID) | ORAL | Status: DC
  Administered 2017-12-26 – 2017-12-31 (×10): 12.5 mg via ORAL
  Filled 2017-12-26 (×11): qty 1

## 2017-12-26 MED ORDER — MORPHINE SULFATE (PF) 4 MG/ML IV SOLN: 1.0000 mg | INTRAVENOUS | Status: DC | PRN

## 2017-12-26 MED ORDER — BISACODYL 5 MG PO TBEC: 5.0000 mg | DELAYED_RELEASE_TABLET | Freq: Every day | ORAL | Status: DC | PRN

## 2017-12-26 MED ORDER — SODIUM CHLORIDE 0.9 % IV SOLN
INTRAVENOUS | Status: DC
  Administered 2017-12-26 – 2017-12-27 (×2): via INTRAVENOUS

## 2017-12-26 MED ORDER — HYDROCODONE-ACETAMINOPHEN 5-325 MG PO TABS
1.0000 | ORAL_TABLET | Freq: Four times a day (QID) | ORAL | Status: DC | PRN
  Administered 2017-12-26: 1 via ORAL
  Filled 2017-12-26: qty 1

## 2017-12-26 MED ORDER — MORPHINE SULFATE (PF) 4 MG/ML IV SOLN
1.0000 mg | INTRAVENOUS | Status: DC | PRN
  Administered 2017-12-27: 2 mg via INTRAVENOUS
  Filled 2017-12-26: qty 1

## 2017-12-26 MED ORDER — POVIDONE-IODINE 10 % EX SWAB: 2.0000 "application " | Freq: Once | CUTANEOUS | Status: DC

## 2017-12-26 NOTE — ED Notes (Signed)
Ortho PA bedside discussing plan of care with pt and family

## 2017-12-26 NOTE — ED Notes (Signed)
Meal tray ordered for pt at this time.

## 2017-12-26 NOTE — ED Notes (Signed)
Per ortho, pt not able to be scheduled for surgery today, will go tomorrow

## 2017-12-26 NOTE — H&P (Addendum)
History and Physical    ARMELIA Arnold FXT:[id number] DOB: [birth year] DOA: 12/26/2017   PCP: Patient, No Pcp Per   Patient coming from:  Home    Chief Complaint: Fall with fracture   HPI: Erica Arnold is a 84 y.o. female with medical history significant for HTN, history of TIA not on ASA, COPD not on meds, brought to the emergency department today after suffering a mechanical fall at home, landing on the right side.  The patient to get up and walk since then.  She denies any loss of consciousness.  She denies any head injury or vision changes.  She denies any neck pain or trouble swallowing.  She denies any pain or weakness in her arms. When she presented, the right hip pain was 10 out of 10, that after medication administration, was reduced to about 2 out of 10.  She denies any other areas of injury.  She denies any fever or chills or recent infections.  She denies any chest pain, she has occasional palpitations, but she reports that this is due to not using her beta-blocker as instructed.  She denies any shortness of breath, or cough.  She denies any abdominal pain, nausea or vomiting. No diarrhea  She denies any significant leg swelling.  She does have external rotation of her right leg after the fall.  She denies any unilateral weakness prior to the event.  Of note, the patient states that at times, she has tingling similar to her TIA, but none over the last few weeks.  She states that when the symptoms appear, she takes an aspirin.She is a poor historian, and does not offer or elaborate further information.  She is very selective on the responses.  Tobacco, alcohol or recreational drug use.  No recent long distance travels.   ED Course:  BP (!) 155/77   Pulse 82   Temp 98.3 F (36.8 C) (Oral)   Resp 18   Wt 44.5 kg (98 lb)   SpO2 95%   BMI 16.82 kg/m   Chemistries are essentially unremarkable and CBC is remarkable for platelets, 124, and white count 10.9. EKG shows sinus rhythm,  right atrial enlargement, right axis deviation, no significant change from prior.  X-ray of the right hip shows right subcapital femoral neck fracture. Soft tissue calcifications are noted adjacent to the greater trochanter. She received morphine IV, with significant improvement of the pain control. Appendix has evaluated the patient, recommending to OR in the morning.  She will be n.p.o. after midnight.  Review of Systems:  As per HPI otherwise all other systems reviewed and are negative  Past Medical History:  Diagnosis Date  . Seasonal allergies   . TIA (transient ischemic attack)     History reviewed. No pertinent surgical history.  Social History Social History   Socioeconomic History  . Marital status: Single    Spouse name: Not on file  . Number of children: Not on file  . Years of education: Not on file  . Highest education level: Not on file  Occupational History  . Not on file  Social Needs  . Financial resource strain: Not on file  . Food insecurity:    Worry: Not on file    Inability: Not on file  . Transportation needs:    Medical: Not on file    Non-medical: Not on file  Tobacco Use  . Smoking status: Never Smoker  Substance and Sexual Activity  . Alcohol use: No  .  Drug use: No  . Sexual activity: Not on file  Lifestyle  . Physical activity:    Days per week: Not on file    Minutes per session: Not on file  . Stress: Not on file  Relationships  . Social connections:    Talks on phone: Not on file    Gets together: Not on file    Attends religious service: Not on file    Active member of club or organization: Not on file    Attends meetings of clubs or organizations: Not on file    Relationship status: Not on file  . Intimate partner violence:    Fear of current or ex partner: Not on file    Emotionally abused: Not on file    Physically abused: Not on file    Forced sexual activity: Not on file  Other Topics Concern  . Not on file  Social  History Narrative  . Not on file     Allergies  Allergen Reactions  . Doxycycline Shortness Of Breath  . Tetanus Toxoids Anaphylaxis    shock  . Other Other (See Comments)    BP medication given at [facility] caused BP to drop drastically.     History reviewed. No pertinent family history.    Prior to Admission medications   Medication Sig Start Date End Date Taking? Authorizing Provider  loratadine (CLARITIN) 10 MG tablet Take 0-10 mg by mouth daily as needed for allergies.    Yes [provider]  metoprolol tartrate (LOPRESSOR) 25 MG tablet Take 0-25 mg by mouth as needed (for mini-strokes).   Yes [provider]    Physical Exam:  Vitals:   12/26/17 0945 12/26/17 1130 12/26/17 1200 12/26/17 1230  BP:  (!) 155/85 (!) 163/83 (!) 155/77  Pulse:  83 90 82  Resp:  (!) 21 (!) 23 18  Temp:      TempSrc:      SpO2:  96% 95% 95%  Weight: 44.5 kg (98 lb)      Constitutional: NAD, calm, uncomfortable appearing, very thin, frail  eyes: PERRL, lids and conjunctivae normal .  But I wasting noted ENMT: Mucous membranes are moist, without exudate or lesions  Neck: normal, supple, no masses, no thyromegaly Respiratory: clear to auscultation bilaterally, no wheezing, no crackles. Normal respiratory effort  Cardiovascular: Regular rate and rhythm,  murmur, rubs or gallops. No extremity edema. 2+ pedal pulses. No carotid bruits.  Abdomen: Soft, non tender, No hepatosplenomegaly. Bowel sounds positive.  Musculoskeletal: no clubbing / cyanosis. Moves all extremities, except right lower extremity, which is immobilized.  The area is tender to palpation at the hip, external rotation of the leg is noted.  Distal pulses are present.  There is no significant edema. Skin: no jaundice, No lesions.  Neurologic: Sensation intact  Strength equal in all extremities Psychiatric:   Alert and oriented x 3. Normal mood.     Labs on Admission: I have personally reviewed following labs and  imaging studies  CBC: Recent Labs  Lab 12/26/17 1051  WBC 10.9*  NEUTROABS 10.0*  HGB 15.5*  HCT 46.8*  MCV 90.3  PLT 124*    Basic Metabolic Panel: Recent Labs  Lab 12/26/17 1051  NA 141  K 3.7  CL 103  CO2 27  GLUCOSE 120*  BUN 16  CREATININE 0.75  CALCIUM 9.1    GFR: CrCl cannot be calculated (Unknown ideal weight.).  Liver Function Tests: No results for input(s): AST, ALT, ALKPHOS, BILITOT,  PROT, ALBUMIN in the last 168 hours. No results for input(s): LIPASE, AMYLASE in the last 168 hours. No results for input(s): AMMONIA in the last 168 hours.  Coagulation Profile: Recent Labs  Lab 12/26/17 1051  INR 0.96  PT 12.6, PTT 31   Cardiac Enzymes: No results for input(s): CKTOTAL, CKMB, CKMBINDEX, TROPONINI in the last 168 hours.  BNP (last 3 results) No results for input(s): PROBNP in the last 8760 hours.  HbA1C: No results for input(s): HGBA1C in the last 72 hours.  CBG: No results for input(s): GLUCAP in the last 168 hours.  Lipid Profile: No results for input(s): CHOL, HDL, LDLCALC, TRIG, CHOLHDL, LDLDIRECT in the last 72 hours.  Thyroid Function Tests: No results for input(s): TSH, T4TOTAL, FREET4, T3FREE, THYROIDAB in the last 72 hours.  Anemia Panel: No results for input(s): VITAMINB12, FOLATE, FERRITIN, TIBC, IRON, RETICCTPCT in the last 72 hours.  Urine analysis: No results found for: COLORURINE, APPEARANCEUR, LABSPEC, PHURINE, GLUCOSEU, HGBUR, BILIRUBINUR, KETONESUR, PROTEINUR, UROBILINOGEN, NITRITE, LEUKOCYTESUR  Sepsis Labs: @LABRCNTIP (procalcitonin:4,lacticidven:4) )No results found for this or any previous visit (from the past 240 hour(s)).   Radiological Exams on Admission: Dg Chest 1 View  Result Date: 12/26/2017 CLINICAL DATA:  Recent fall with right hip fracture, initial encounter EXAM: CHEST  1 VIEW COMPARISON:  12/17/2008 FINDINGS: Cardiac shadow is within normal limits. Skin folds are noted bilaterally. The lungs are clear  without focal infiltrate. No acute bony abnormality is noted. IMPRESSION: No acute abnormality seen. Electronically Signed   By: Inez Catalina M.D.   On: 12/26/2017 10:49   Dg Hip Unilat With Pelvis 2-3 Views Right  Result Date: 12/26/2017 CLINICAL DATA:  Recent fall with right hip pain, initial encounter EXAM: DG HIP (WITH OR WITHOUT PELVIS) 3V RIGHT COMPARISON:  None. FINDINGS: Pelvic ring is intact. A the subcapital femoral neck fracture is identified with impaction at the fracture site. Some soft tissue calcification is noted adjacent to the intratrochanteric region. This is of uncertain chronicity. No other focal abnormality is seen IMPRESSION: Right subcapital femoral neck fracture. Soft tissue calcifications are noted adjacent to the greater trochanter. Electronically Signed   By: Inez Catalina M.D.   On: 12/26/2017 10:49    EKG: Independently reviewed. EKG shows sinus rhythm, right atrial enlargement, right 86 deviation, no significant change from prior.  Assessment/Plan Active Problems:   Essential hypertension   History of TIA (transient ischemic attack)   History of COPD   Thrombocytopenia (HCC)   Right  Subcapital femoral neck fracture CXR NAD. CBC remarkable for WBC 10.9, Afebrile  Received  IV pain meds, immobilized.  Risk of major cardiac event in surgery is 1 (0.9 %) based on all other lab findiongs    Admit to med surg, anticipating surgery as per Ortho Hip fracture order set NPOafter midnight  SCDs, platelets 124k  IVF  Pain control with Morphine and oral meds  PT/OT consult postop   Hypertension BP  155/77   Pulse 82    Resume  home anti-hypertensive medications with metoprolol    HIstory of TIA, no acute issues at this time, patient selective historian, unable to tell when last TIA sx were present. Platelets 124k  WIll continue to monitor closely  MAy consider ASA after surgery unless plts less than 70 k   COPD  (patient denies ) without exacerbation Osats normal  in RA   CXR NAD  WBC 11  NO wheezing on exam  O2 prn  Patient declines any nebs prn  at this time,  IS while awake q 4 h   Thrombocytopenia, appears acute, patient not on ASA, no significant bruising or bleeding issues at this time . Plt 124k . No infection is noted or splenomegaly on exam  No transfusion is indicated at this time Monitor counts closely Transfuse 1 unit of platelets if count is less or equal than 10,000 or 20,000 if the patient is acutely bleeding     DVT prophylaxis:  SCD  Code Status:    DNR  Family Communication:  Discussed with patient Disposition Plan: Expect patient to be discharged to home versus SNF  after condition improves Consults called:    Ortho, per EDP  Admission status: IP Medsurg    Sharene Butters, PA-C [facility]   Amion text  [phone]   12/26/2017, 12:53 PM

## 2017-12-26 NOTE — ED Notes (Signed)
Family brought pt [employer]; pt has no complaints at this time

## 2017-12-26 NOTE — Consult Note (Addendum)
Reason for Consult:Hip fx Referring Physician: K Yamilet Arnold is an 84 y.o. female.  HPI: Erica Arnold was bending down to get a pillow from behind her bed and slipped and fell, landing on her right side. She had immediate pain and could not get up. A family member came and helped but she felt like she needed to come to the ED. X-rays showed a right hip fx and orthopedic surgery was consulted. She c/o localized pain in the area but no other c/o. She does have a hard mass over the area of the break that's been there for a couple of years. It doesn't really bother her.  Past Medical History:  Diagnosis Date  . Seasonal allergies   . TIA (transient ischemic attack)     History reviewed. No pertinent surgical history.  History reviewed. No pertinent family history.  Social History:  reports that she has never smoked. She does not have any smokeless tobacco history on file. She reports that she does not drink alcohol or use drugs.  Allergies:  Allergies  Allergen Reactions  . Doxycycline     Medications: I have reviewed the patient's current medications.  Results for orders placed or performed during the hospital encounter of 12/26/17 (from the past 48 hour(s))  Basic metabolic panel     Status: Abnormal   Collection Time: 12/26/17 10:51 AM  Result Value Ref Range   Sodium 141 135 - 145 mmol/L   Potassium 3.7 3.5 - 5.1 mmol/L   Chloride 103 101 - 111 mmol/L   CO2 27 22 - 32 mmol/L   Glucose, Bld 120 (H) 65 - 99 mg/dL   BUN 16 6 - 20 mg/dL   Creatinine, Ser 0.75 0.44 - 1.00 mg/dL   Calcium 9.1 8.9 - 10.3 mg/dL   GFR calc non Af Amer >60 >60 mL/min   GFR calc Af Amer >60 >60 mL/min    Comment: (NOTE) The eGFR has been calculated using the CKD EPI equation. This calculation has not been validated in all clinical situations. eGFR's persistently <60 mL/min signify possible Chronic Kidney Disease.    Anion gap 11 5 - 15    Comment: Performed at [facility]  [address] [place], [place] [address]  CBC WITH DIFFERENTIAL     Status: Abnormal   Collection Time: 12/26/17 10:51 AM  Result Value Ref Range   WBC 10.9 (H) 4.0 - 10.5 Erica/uL   RBC 5.18 (H) 3.87 - 5.11 MIL/uL   Hemoglobin 15.5 (H) 12.0 - 15.0 g/dL   HCT 46.8 (H) 36.0 - 46.0 %   MCV 90.3 78.0 - 100.0 fL   MCH 29.9 26.0 - 34.0 pg   MCHC 33.1 30.0 - 36.0 g/dL   RDW 12.8 11.5 - 15.5 %   Platelets 124 (L) 150 - 400 Erica/uL   Neutrophils Relative % 91 %   Neutro Abs 10.0 (H) 1.7 - 7.7 Erica/uL   Lymphocytes Relative 4 %   Lymphs Abs 0.5 (L) 0.7 - 4.0 Erica/uL   Monocytes Relative 3 %   Monocytes Absolute 0.3 0.1 - 1.0 Erica/uL   Eosinophils Relative 0 %   Eosinophils Absolute 0.0 0.0 - 0.7 Erica/uL   Basophils Relative 1 %   Basophils Absolute 0.1 0.0 - 0.1 Erica/uL   Immature Granulocytes 1 %   Abs Immature Granulocytes 0.1 0.0 - 0.1 Erica/uL    Comment: Performed at [facility], [address], [place],  [address]  Protime-INR  Status: None   Collection Time: 12/26/17 10:51 AM  Result Value Ref Range   Prothrombin Time 12.6 11.4 - 15.2 seconds   INR 0.96     Comment: Performed at [facility], [place] [address], [place], [place] [address]  Type and screen [place] [facility]     Status: None   Collection Time: 12/26/17 10:54 AM  Result Value Ref Range   ABO/RH(D) O POS    Antibody Screen NEG    Sample Expiration      12/29/2017 Performed at [facility], [place] [address], [place], [place] [address]   ABO/Rh     Status: None (Preliminary result)   Collection Time: 12/26/17 10:54 AM  Result Value Ref Range   ABO/RH(D)      O POS Performed at [facility] [address] [place], [place] [address]     Dg Chest 1 View  Result Date: 12/26/2017 CLINICAL DATA:  Recent fall with right hip fracture, initial encounter EXAM: CHEST  1 VIEW COMPARISON:  12/17/2008 FINDINGS: Cardiac shadow is within normal limits. Skin folds are noted bilaterally. The lungs are clear without  focal infiltrate. No acute bony abnormality is noted. IMPRESSION: No acute abnormality seen. Electronically Signed   By: Inez Catalina M.D.   On: 12/26/2017 10:49   Dg Hip Unilat With Pelvis 2-3 Views Right  Result Date: 12/26/2017 CLINICAL DATA:  Recent fall with right hip pain, initial encounter EXAM: DG HIP (WITH OR WITHOUT PELVIS) 3V RIGHT COMPARISON:  None. FINDINGS: Pelvic ring is intact. A the subcapital femoral neck fracture is identified with impaction at the fracture site. Some soft tissue calcification is noted adjacent to the intratrochanteric region. This is of uncertain chronicity. No other focal abnormality is seen IMPRESSION: Right subcapital femoral neck fracture. Soft tissue calcifications are noted adjacent to the greater trochanter. Electronically Signed   By: Inez Catalina M.D.   On: 12/26/2017 10:49    Review of Systems  Constitutional: Negative for weight loss.  HENT: Negative for ear discharge, ear pain, hearing loss and tinnitus.   Eyes: Negative for blurred vision, double vision, photophobia and pain.  Respiratory: Negative for cough, sputum production and shortness of breath.   Cardiovascular: Negative for chest pain.  Gastrointestinal: Negative for abdominal pain, nausea and vomiting.  Genitourinary: Negative for dysuria, flank pain, frequency and urgency.  Musculoskeletal: Positive for joint pain (Right hip). Negative for back pain, falls, myalgias and neck pain.  Neurological: Negative for dizziness, tingling, sensory change, focal weakness, loss of consciousness and headaches.  Endo/Heme/Allergies: Does not bruise/bleed easily.  Psychiatric/Behavioral: Negative for depression, memory loss and substance abuse. The patient is not nervous/anxious.    Blood pressure (!) 155/85, pulse 83, temperature 98.3 F (36.8 C), temperature source Oral, resp. rate (!) 21, weight 44.5 kg (98 lb), SpO2 96 %. Physical Exam  Constitutional: She appears well-developed and  well-nourished. No distress.  HENT:  Head: Normocephalic and atraumatic.  Eyes: Conjunctivae are normal. Right eye exhibits no discharge. Left eye exhibits no discharge. No scleral icterus.  Neck: Normal range of motion.  Cardiovascular: Normal rate and regular rhythm.  Respiratory: Effort normal. No respiratory distress.  Musculoskeletal:  RLE No traumatic wounds, ecchymosis, or rash  TTP hip, mobile firm mass overlying greater trochanter  No knee or ankle effusion  Knee stable to varus/ valgus and anterior/posterior stress  Sens DPN, SPN, TN intact  Motor EHL, ext, flex, evers 5/5  DP 2+, PT 2+,  No significant edema  Neurological: She is alert.  Skin: Skin is warm and dry. She is not diaphoretic.  Psychiatric: She has a normal mood and affect. Her behavior is normal.    Assessment/Plan: Fall Right hip fx -- Will need hip hemi tomorrow morning by Dr. Mardelle Matte. NPO after MN.  COPD -- Only a problem with HTN flares or allergies HTN -- Only on prn meds  Note: Very sensitive to medications    Lisette Abu, PA-C Orthopedic Surgery [phone] 12/26/2017, 11:50 AM   Discussed and reviewed films and agree with above.  Plan for surgery tomorrow for hemiarthroplasty.  Will likely also remove hip mass and send to pathology.

## 2017-12-26 NOTE — ED Triage Notes (Signed)
Pt arrives via EMS from home with fall on carpet. Pt unable to walk since fall. Right leg shortened and rotated. Pts distal sensation, pulse intact. Pt received 20g RAC, 41mcg fentanyl PTA. Pt reports pain 10/10 moving but 2/10 at rest. Alert, oriented x4.

## 2017-12-26 NOTE — ED Provider Notes (Signed)
[facility] EMERGENCY DEPARTMENT Provider Note   CSN: [id number] Arrival date & time: 12/26/17  9233     History   Chief Complaint Chief Complaint  Patient presents with  . Fall    HPI Erica Arnold is a 84 y.o. female.  HPI A 84 year old female who presents the emergency department today after mechanical fall today at home where she landed onto her right side.  She is been unable to get up and walk since then.  EMS report leg is shortened and rotated.  Patient received 50 mics of fentanyl prior to arrival.  No head injury.  No use of anticoagulants.  Denies neck pain.  No pain or weakness in her arms.  Pain in the right hip is 10 out of 10 with movement but otherwise 2 out of 10 at this time.  No other injury.  No fevers or chills.  Denies chest pain shortness of breath.  No abdominal pain.   Past Medical History:  Diagnosis Date  . Seasonal allergies   . TIA (transient ischemic attack)     Patient Active Problem List   Diagnosis Date Noted  . HYPERTENSION 12/23/2008  . COPD 12/23/2008  . DYSPNEA 12/23/2008    History reviewed. No pertinent surgical history.   OB History   None      Home Medications    Prior to Admission medications   Medication Sig Start Date End Date Taking? Authorizing Provider  cephALEXin (KEFLEX) 500 MG capsule Take 1 capsule (500 mg total) by mouth 2 (two) times daily. For 10 days 01/10/15   Tyrell Antonio    Family History History reviewed. No pertinent family history.  Social History Social History   Tobacco Use  . Smoking status: Never Smoker  Substance Use Topics  . Alcohol use: No  . Drug use: No     Allergies   Doxycycline   Review of Systems Review of Systems  All other systems reviewed and are negative.    Physical Exam Updated Vital Signs BP (!) 175/83 (BP Location: Left Arm)   Pulse 88   Temp 98.3 F (36.8 C) (Oral)   Resp (!) 21   Wt 44.5 kg (98 lb)   SpO2 97%   BMI 16.82  kg/m   Physical Exam  Constitutional: She is oriented to person, place, and time. She appears well-developed and well-nourished. No distress.  HENT:  Head: Normocephalic and atraumatic.  Eyes: EOM are normal.  Neck: Normal range of motion. Neck supple.  Cardiovascular: Normal rate, regular rhythm and normal heart sounds.  Pulmonary/Chest: Effort normal and breath sounds normal.  Abdominal: Soft. She exhibits no distension. There is no tenderness.  Musculoskeletal:  Full range of motion of bilateral shoulders, elbows, wrist.  Full range of motion of bilateral knees and ankles.  Full range of motion of left hip.  Limited range of motion of the right hip secondary to pain.  Mild shortening and external rotation of the right lower extremity as compared to the left.  Normal pulses right foot.  Compartments of right lower extremity are soft.  Tenderness at the lateral aspect of the right hip.  Neurological: She is alert and oriented to person, place, and time.  Skin: Skin is warm and dry.  Psychiatric: She has a normal mood and affect. Judgment normal.  Nursing note and vitals reviewed.    ED Treatments / Results  Labs (all labs ordered are listed, but only abnormal results are displayed) Labs  Reviewed  BASIC METABOLIC PANEL  CBC WITH DIFFERENTIAL/PLATELET  PROTIME-INR  TYPE AND SCREEN    EKG EKG Interpretation  Date/Time:  Wednesday Dec 26 2017 09:39:43 EDT Ventricular Rate:  92 PR Interval:    QRS Duration: 84 QT Interval:  347 QTC Calculation: 430 R Axis:   95 Text Interpretation:  Sinus rhythm Right atrial enlargement Right axis deviation No significant change was found Confirmed by Jola Schmidt [phone]) on 12/26/2017 11:35:06 AM   Radiology Dg Chest 1 View  Result Date: 12/26/2017 CLINICAL DATA:  Recent fall with right hip fracture, initial encounter EXAM: CHEST  1 VIEW COMPARISON:  12/17/2008 FINDINGS: Cardiac shadow is within normal limits. Skin folds are noted  bilaterally. The lungs are clear without focal infiltrate. No acute bony abnormality is noted. IMPRESSION: No acute abnormality seen. Electronically Signed   By: Inez Catalina M.D.   On: 12/26/2017 10:49   Dg Hip Unilat With Pelvis 2-3 Views Right  Result Date: 12/26/2017 CLINICAL DATA:  Recent fall with right hip pain, initial encounter EXAM: DG HIP (WITH OR WITHOUT PELVIS) 3V RIGHT COMPARISON:  None. FINDINGS: Pelvic ring is intact. A the subcapital femoral neck fracture is identified with impaction at the fracture site. Some soft tissue calcification is noted adjacent to the intratrochanteric region. This is of uncertain chronicity. No other focal abnormality is seen IMPRESSION: Right subcapital femoral neck fracture. Soft tissue calcifications are noted adjacent to the greater trochanter. Electronically Signed   By: Inez Catalina M.D.   On: 12/26/2017 10:49    Procedures Procedures (including critical care time)  Medications Ordered in ED Medications  morphine 4 MG/ML injection 4 mg (4 mg Intravenous Given 12/26/17 1018)     Initial Impression / Assessment and Plan / ED Course  I have reviewed the triage vital signs and the nursing notes.  Pertinent labs & imaging results that were available during my care of the patient were reviewed by me and considered in my medical decision making (see chart for details).     11:33 AM Right subcapital hip fracture.  No head or neck injury.  No weakness of her upper extremities.  N.p.o.  Orthopedic consultation.  Hospitalist admission.  Patient and family updated.  Orthopedics: Silvestre Gunner PA [facility]  Preoperative labs and xrays ordered   Final Clinical Impressions(s) / ED Diagnoses   Final diagnoses:  Closed right hip fracture, initial encounter [facility])    ED Discharge Orders    None       Jola Schmidt, MD 12/26/17 1136

## 2017-12-26 NOTE — ED Notes (Signed)
Consent for procedure signed by RN and patient, placed bedside

## 2017-12-27 ENCOUNTER — Encounter (HOSPITAL_COMMUNITY)
Admission: EM | Disposition: A | Discharge status: Skilled Nursing Facility | Payer: Self-pay | Source: Home / Self Care | Attending: Internal Medicine

## 2017-12-27 ENCOUNTER — Inpatient Hospital Stay (HOSPITAL_COMMUNITY): Payer: Medicare Other

## 2017-12-27 ENCOUNTER — Encounter (HOSPITAL_COMMUNITY): Payer: Self-pay | Admitting: Certified Registered"

## 2017-12-27 ENCOUNTER — Inpatient Hospital Stay (HOSPITAL_COMMUNITY): Payer: Medicare Other | Admitting: Anesthesiology

## 2017-12-27 DIAGNOSIS — Z8709 Personal history of other diseases of the respiratory system: Secondary | ICD-10-CM

## 2017-12-27 DIAGNOSIS — S72011A Unspecified intracapsular fracture of right femur, initial encounter for closed fracture: Principal | ICD-10-CM

## 2017-12-27 DIAGNOSIS — Z8673 Personal history of transient ischemic attack (TIA), and cerebral infarction without residual deficits: Secondary | ICD-10-CM

## 2017-12-27 DIAGNOSIS — D696 Thrombocytopenia, unspecified: Secondary | ICD-10-CM

## 2017-12-27 DIAGNOSIS — I10 Essential (primary) hypertension: Secondary | ICD-10-CM

## 2017-12-27 HISTORY — PX: MASS EXCISION: SHX2000

## 2017-12-27 HISTORY — PX: HIP ARTHROPLASTY: SHX981

## 2017-12-27 LAB — URINALYSIS, ROUTINE W REFLEX MICROSCOPIC
BILIRUBIN URINE: NEGATIVE
GLUCOSE, UA: NEGATIVE mg/dL
KETONES UR: 5 mg/dL — AB
LEUKOCYTES UA: NEGATIVE
NITRITE: NEGATIVE
Protein, ur: NEGATIVE mg/dL
Specific Gravity, Urine: 1.011 (ref 1.005–1.030)
pH: 7 (ref 5.0–8.0)

## 2017-12-27 LAB — CBC
HCT: 45.2 % (ref 36.0–46.0)
Hemoglobin: 15.3 g/dL — ABNORMAL HIGH (ref 12.0–15.0)
MCH: 30.7 pg (ref 26.0–34.0)
MCHC: 33.8 g/dL (ref 30.0–36.0)
MCV: 90.8 fL (ref 78.0–100.0)
Platelets: 113 10*3/uL — ABNORMAL LOW (ref 150–400)
RBC: 4.98 MIL/uL (ref 3.87–5.11)
RDW: 12.8 % (ref 11.5–15.5)
WBC: 9.6 10*3/uL (ref 4.0–10.5)

## 2017-12-27 LAB — GLUCOSE, CAPILLARY: Glucose-Capillary: 139 mg/dL — ABNORMAL HIGH (ref 65–99)

## 2017-12-27 LAB — BASIC METABOLIC PANEL
Anion gap: 8 (ref 5–15)
BUN: 10 mg/dL (ref 6–20)
CHLORIDE: 103 mmol/L (ref 101–111)
CO2: 28 mmol/L (ref 22–32)
CREATININE: 0.6 mg/dL (ref 0.44–1.00)
Calcium: 8.8 mg/dL — ABNORMAL LOW (ref 8.9–10.3)
GFR calc Af Amer: >60 mL/min (ref >60)
GFR calc non Af Amer: >60 mL/min (ref >60)
Glucose, Bld: 98 mg/dL (ref 65–99)
Potassium: 3.5 mmol/L (ref 3.5–5.1)
SODIUM: 139 mmol/L (ref 135–145)

## 2017-12-27 LAB — SURGICAL PCR SCREEN
MRSA, PCR: NEGATIVE
STAPHYLOCOCCUS AUREUS: NEGATIVE

## 2017-12-27 SURGERY — HEMIARTHROPLASTY, HIP, DIRECT ANTERIOR APPROACH, FOR FRACTURE
Anesthesia: General | Site: Hip | Laterality: Right

## 2017-12-27 MED ORDER — CEFAZOLIN SODIUM-DEXTROSE 2-4 GM/100ML-% IV SOLN
2.0000 g | INTRAVENOUS | Status: AC
  Administered 2017-12-27: 2 g via INTRAVENOUS
  Filled 2017-12-27: qty 100

## 2017-12-27 MED ORDER — ACETAMINOPHEN 325 MG PO TABS
325.0000 mg | ORAL_TABLET | Freq: Four times a day (QID) | ORAL | Status: DC | PRN
  Administered 2017-12-28 – 2017-12-31 (×5): 325 mg via ORAL
  Filled 2017-12-27 (×3): qty 1
  Filled 2017-12-27: qty 2
  Filled 2017-12-27: qty 1

## 2017-12-27 MED ORDER — ALUM & MAG HYDROXIDE-SIMETH 200-200-20 MG/5ML PO SUSP: 30.0000 mL | ORAL | Status: DC | PRN

## 2017-12-27 MED ORDER — ENOXAPARIN SODIUM 30 MG/0.3ML ~~LOC~~ SOLN
30.0000 mg | SUBCUTANEOUS | Status: DC
  Administered 2017-12-28 – 2017-12-31 (×5): 30 mg via SUBCUTANEOUS
  Filled 2017-12-27 (×4): qty 0.3

## 2017-12-27 MED ORDER — DEXAMETHASONE SODIUM PHOSPHATE 10 MG/ML IJ SOLN
INTRAMUSCULAR | Status: DC | PRN
  Administered 2017-12-27: 5 mg via INTRAVENOUS

## 2017-12-27 MED ORDER — PHENYLEPHRINE 40 MCG/ML (10ML) SYRINGE FOR IV PUSH (FOR BLOOD PRESSURE SUPPORT)
PREFILLED_SYRINGE | INTRAVENOUS | Status: AC
  Filled 2017-12-27: qty 20

## 2017-12-27 MED ORDER — PHENYLEPHRINE HCL 10 MG/ML IJ SOLN
INTRAVENOUS | Status: DC | PRN
  Administered 2017-12-27: 40 ug/min via INTRAVENOUS

## 2017-12-27 MED ORDER — BACLOFEN 10 MG PO TABS: 10.0000 mg | ORAL_TABLET | Freq: Three times a day (TID) | ORAL | 0 refills | Status: DC

## 2017-12-27 MED ORDER — HYDROCODONE-ACETAMINOPHEN 5-325 MG PO TABS
1.0000 | ORAL_TABLET | ORAL | Status: DC | PRN
  Administered 2017-12-29: 1 via ORAL
  Filled 2017-12-27: qty 2

## 2017-12-27 MED ORDER — ONDANSETRON HCL 4 MG/2ML IJ SOLN
INTRAMUSCULAR | Status: AC
  Filled 2017-12-27: qty 6

## 2017-12-27 MED ORDER — SODIUM CHLORIDE 0.9 % IJ SOLN
INTRAMUSCULAR | Status: AC
  Filled 2017-12-27: qty 10

## 2017-12-27 MED ORDER — PROPOFOL 10 MG/ML IV BOLUS
INTRAVENOUS | Status: DC | PRN
  Administered 2017-12-27: 80 mg via INTRAVENOUS

## 2017-12-27 MED ORDER — METOCLOPRAMIDE HCL 5 MG/ML IJ SOLN: 5.0000 mg | Freq: Three times a day (TID) | INTRAMUSCULAR | Status: DC | PRN

## 2017-12-27 MED ORDER — POLYETHYLENE GLYCOL 3350 17 G PO PACK: 17.0000 g | PACK | Freq: Every day | ORAL | Status: DC | PRN

## 2017-12-27 MED ORDER — ENOXAPARIN SODIUM 30 MG/0.3ML ~~LOC~~ SOLN: 30.0000 mg | SUBCUTANEOUS | 0 refills | Status: AC

## 2017-12-27 MED ORDER — BISACODYL 10 MG RE SUPP
10.0000 mg | Freq: Every day | RECTAL | Status: DC | PRN
  Administered 2017-12-30: 10 mg via RECTAL
  Filled 2017-12-27: qty 1

## 2017-12-27 MED ORDER — EPHEDRINE SULFATE-NACL 50-0.9 MG/10ML-% IV SOSY
PREFILLED_SYRINGE | INTRAVENOUS | Status: DC | PRN
  Administered 2017-12-27: 5 mg via INTRAVENOUS
  Administered 2017-12-27: 10 mg via INTRAVENOUS

## 2017-12-27 MED ORDER — DOCUSATE SODIUM 100 MG PO CAPS
100.0000 mg | ORAL_CAPSULE | Freq: Two times a day (BID) | ORAL | Status: DC
  Administered 2017-12-27 – 2017-12-31 (×7): 100 mg via ORAL
  Filled 2017-12-27 (×7): qty 1

## 2017-12-27 MED ORDER — ROCURONIUM BROMIDE 10 MG/ML (PF) SYRINGE
PREFILLED_SYRINGE | INTRAVENOUS | Status: DC | PRN
  Administered 2017-12-27: 40 mg via INTRAVENOUS

## 2017-12-27 MED ORDER — METOCLOPRAMIDE HCL 5 MG PO TABS: 5.0000 mg | ORAL_TABLET | Freq: Three times a day (TID) | ORAL | Status: DC | PRN

## 2017-12-27 MED ORDER — LIDOCAINE 2% (20 MG/ML) 5 ML SYRINGE
INTRAMUSCULAR | Status: DC | PRN
  Administered 2017-12-27: 60 mg via INTRAVENOUS

## 2017-12-27 MED ORDER — DEXAMETHASONE SODIUM PHOSPHATE 10 MG/ML IJ SOLN
INTRAMUSCULAR | Status: AC
  Filled 2017-12-27: qty 2

## 2017-12-27 MED ORDER — SUGAMMADEX SODIUM 200 MG/2ML IV SOLN
INTRAVENOUS | Status: AC
  Filled 2017-12-27: qty 2

## 2017-12-27 MED ORDER — PHENOL 1.4 % MT LIQD: 1.0000 | OROMUCOSAL | Status: DC | PRN

## 2017-12-27 MED ORDER — LIDOCAINE 2% (20 MG/ML) 5 ML SYRINGE
INTRAMUSCULAR | Status: AC
  Filled 2017-12-27: qty 15

## 2017-12-27 MED ORDER — ACETAMINOPHEN 500 MG PO TABS
500.0000 mg | ORAL_TABLET | Freq: Four times a day (QID) | ORAL | Status: AC
  Administered 2017-12-27 – 2017-12-28 (×3): 500 mg via ORAL
  Filled 2017-12-27 (×3): qty 1

## 2017-12-27 MED ORDER — ROCURONIUM BROMIDE 50 MG/5ML IV SOLN
INTRAVENOUS | Status: AC
  Filled 2017-12-27: qty 1

## 2017-12-27 MED ORDER — MAGNESIUM CITRATE PO SOLN: 1.0000 | Freq: Once | ORAL | Status: DC | PRN

## 2017-12-27 MED ORDER — PROPOFOL 10 MG/ML IV BOLUS
INTRAVENOUS | Status: AC
  Filled 2017-12-27: qty 20

## 2017-12-27 MED ORDER — ONDANSETRON HCL 4 MG/2ML IJ SOLN
INTRAMUSCULAR | Status: DC | PRN
  Administered 2017-12-27: 4 mg via INTRAVENOUS

## 2017-12-27 MED ORDER — PHENYLEPHRINE 40 MCG/ML (10ML) SYRINGE FOR IV PUSH (FOR BLOOD PRESSURE SUPPORT)
PREFILLED_SYRINGE | INTRAVENOUS | Status: DC | PRN
  Administered 2017-12-27: 120 ug via INTRAVENOUS
  Administered 2017-12-27: 240 ug via INTRAVENOUS

## 2017-12-27 MED ORDER — MENTHOL 3 MG MT LOZG: 1.0000 | LOZENGE | OROMUCOSAL | Status: DC | PRN

## 2017-12-27 MED ORDER — ONDANSETRON HCL 4 MG/2ML IJ SOLN
4.0000 mg | Freq: Four times a day (QID) | INTRAMUSCULAR | Status: DC | PRN
Start: 2017-12-27 — End: 2017-12-31

## 2017-12-27 MED ORDER — LACTATED RINGERS IV SOLN
INTRAVENOUS | Status: DC
  Administered 2017-12-27 (×2): via INTRAVENOUS

## 2017-12-27 MED ORDER — FERROUS SULFATE 325 (65 FE) MG PO TABS
325.0000 mg | ORAL_TABLET | Freq: Three times a day (TID) | ORAL | Status: DC
  Administered 2017-12-27 – 2017-12-31 (×11): 325 mg via ORAL
  Filled 2017-12-27 (×10): qty 1

## 2017-12-27 MED ORDER — SODIUM CHLORIDE 0.9 % IR SOLN
Status: DC | PRN
  Administered 2017-12-27 (×2): 1000 mL

## 2017-12-27 MED ORDER — CEFAZOLIN SODIUM-DEXTROSE 2-4 GM/100ML-% IV SOLN
2.0000 g | Freq: Four times a day (QID) | INTRAVENOUS | Status: AC
  Administered 2017-12-27 – 2017-12-28 (×2): 2 g via INTRAVENOUS
  Filled 2017-12-27 (×2): qty 100

## 2017-12-27 MED ORDER — ACETAMINOPHEN 325 MG PO TABS: 650.0000 mg | ORAL_TABLET | Freq: Four times a day (QID) | ORAL | 1 refills | Status: DC | PRN

## 2017-12-27 MED ORDER — KETOROLAC TROMETHAMINE 15 MG/ML IJ SOLN
7.5000 mg | Freq: Four times a day (QID) | INTRAMUSCULAR | Status: AC
  Administered 2017-12-27 – 2017-12-28 (×3): 7.5 mg via INTRAVENOUS
  Filled 2017-12-27 (×3): qty 1

## 2017-12-27 MED ORDER — SUGAMMADEX SODIUM 200 MG/2ML IV SOLN
INTRAVENOUS | Status: DC | PRN
  Administered 2017-12-27: 90 mg via INTRAVENOUS

## 2017-12-27 MED ORDER — FENTANYL CITRATE (PF) 250 MCG/5ML IJ SOLN
INTRAMUSCULAR | Status: AC
  Filled 2017-12-27: qty 5

## 2017-12-27 MED ORDER — POTASSIUM CHLORIDE IN NACL 20-0.9 MEQ/L-% IV SOLN
INTRAVENOUS | Status: DC
  Administered 2017-12-27: 21:00:00 via INTRAVENOUS
  Filled 2017-12-27: qty 1000

## 2017-12-27 MED ORDER — FENTANYL CITRATE (PF) 250 MCG/5ML IJ SOLN
INTRAMUSCULAR | Status: DC | PRN
  Administered 2017-12-27 (×3): 50 ug via INTRAVENOUS

## 2017-12-27 MED ORDER — ARTIFICIAL TEARS OPHTHALMIC OINT
TOPICAL_OINTMENT | OPHTHALMIC | Status: AC
  Filled 2017-12-27: qty 3.5

## 2017-12-27 MED ORDER — EPHEDRINE SULFATE 50 MG/ML IJ SOLN
INTRAMUSCULAR | Status: AC
  Filled 2017-12-27: qty 1

## 2017-12-27 MED ORDER — ONDANSETRON HCL 4 MG PO TABS: 4.0000 mg | ORAL_TABLET | Freq: Four times a day (QID) | ORAL | Status: DC | PRN

## 2017-12-27 MED ORDER — MORPHINE SULFATE (PF) 2 MG/ML IV SOLN: 0.5000 mg | INTRAVENOUS | Status: DC | PRN

## 2017-12-27 SURGICAL SUPPLY — 60 items
BENZOIN TINCTURE PRP APPL 2/3 (GAUZE/BANDAGES/DRESSINGS) ×3 IMPLANT
BLADE SAW SGTL 18.5X63.X.64 HD (BLADE) ×3 IMPLANT
BRUSH FEMORAL CANAL (MISCELLANEOUS) ×3 IMPLANT
CAPT HIP HEMI 1 ×3 IMPLANT
CEMENT BONE DEPUY (Cement) ×6 IMPLANT
CEMENT RESTRICTOR DEPUY SZ 4 (Cement) ×3 IMPLANT
CLOSURE STERI-STRIP 1/2X4 (GAUZE/BANDAGES/DRESSINGS) ×1
CLSR STERI-STRIP ANTIMIC 1/2X4 (GAUZE/BANDAGES/DRESSINGS) ×2 IMPLANT
COVER BACK TABLE 24X17X13 BIG (DRAPES) IMPLANT
COVER SURGICAL LIGHT HANDLE (MISCELLANEOUS) ×3 IMPLANT
DRAPE IMP U-DRAPE 54X76 (DRAPES) ×3 IMPLANT
DRAPE INCISE IOBAN 66X45 STRL (DRAPES) ×3 IMPLANT
DRAPE ORTHO SPLIT 77X108 STRL (DRAPES) ×4
DRAPE SURG ORHT 6 SPLT 77X108 (DRAPES) ×2 IMPLANT
DRAPE U-SHAPE 47X51 STRL (DRAPES) ×3 IMPLANT
DRESSING ALLEVYN LIFE SACRUM (GAUZE/BANDAGES/DRESSINGS) ×3 IMPLANT
DRILL BIT 5/64 (BIT) ×3 IMPLANT
DRSG MEPILEX BORDER 4X12 (GAUZE/BANDAGES/DRESSINGS) IMPLANT
DRSG MEPILEX BORDER 4X8 (GAUZE/BANDAGES/DRESSINGS) ×3 IMPLANT
DURAPREP 26ML APPLICATOR (WOUND CARE) ×3 IMPLANT
ELECT BLADE 6.5 EXT (BLADE) IMPLANT
ELECT CAUTERY BLADE 6.4 (BLADE) ×3 IMPLANT
ELECT REM PT RETURN 9FT ADLT (ELECTROSURGICAL) ×3
ELECTRODE REM PT RTRN 9FT ADLT (ELECTROSURGICAL) ×1 IMPLANT
FACESHIELD WRAPAROUND (MASK) ×6 IMPLANT
GLOVE BIOGEL PI ORTHO PRO SZ8 (GLOVE) ×2
GLOVE ORTHO TXT STRL SZ7.5 (GLOVE) ×3 IMPLANT
GLOVE PI ORTHO PRO STRL SZ8 (GLOVE) ×1 IMPLANT
GLOVE SURG ORTHO 8.0 STRL STRW (GLOVE) ×6 IMPLANT
GOWN STRL REUS W/ TWL XL LVL3 (GOWN DISPOSABLE) ×1 IMPLANT
GOWN STRL REUS W/TWL 2XL LVL3 (GOWN DISPOSABLE) ×3 IMPLANT
GOWN STRL REUS W/TWL XL LVL3 (GOWN DISPOSABLE) ×2
HANDPIECE INTERPULSE COAX TIP (DISPOSABLE) ×2
KIT BASIN OR (CUSTOM PROCEDURE TRAY) ×3 IMPLANT
KIT TURNOVER KIT B (KITS) ×3 IMPLANT
MANIFOLD NEPTUNE II (INSTRUMENTS) ×3 IMPLANT
NDL SUT 6 .5 CRC .975X.05 MAYO (NEEDLE) IMPLANT
NEEDLE 22X1 1/2 (OR ONLY) (NEEDLE) ×3 IMPLANT
NEEDLE MAYO TAPER (NEEDLE)
NS IRRIG 1000ML POUR BTL (IV SOLUTION) ×3 IMPLANT
PACK TOTAL JOINT (CUSTOM PROCEDURE TRAY) ×3 IMPLANT
PACK UNIVERSAL I (CUSTOM PROCEDURE TRAY) ×3 IMPLANT
PAD ARMBOARD 7.5X6 YLW CONV (MISCELLANEOUS) ×6 IMPLANT
PILLOW ABDUCTION HIP (SOFTGOODS) ×3 IMPLANT
PRESSURIZER FEMORAL UNIV (MISCELLANEOUS) IMPLANT
RETRIEVER SUT HEWSON (MISCELLANEOUS) ×3 IMPLANT
SET HNDPC FAN SPRY TIP SCT (DISPOSABLE) ×1 IMPLANT
SUT FIBERWIRE #2 38 REV NDL BL (SUTURE) ×6
SUT VIC AB 0 CT1 27 (SUTURE) ×2
SUT VIC AB 0 CT1 27XBRD ANBCTR (SUTURE) ×1 IMPLANT
SUT VIC AB 1 CT1 27 (SUTURE) ×4
SUT VIC AB 1 CT1 27XBRD ANBCTR (SUTURE) ×2 IMPLANT
SUT VIC AB 3-0 SH 8-18 (SUTURE) ×3 IMPLANT
SUTURE FIBERWR#2 38 REV NDL BL (SUTURE) ×2 IMPLANT
SYR CONTROL 10ML LL (SYRINGE) ×3 IMPLANT
TOWEL OR 17X24 6PK STRL BLUE (TOWEL DISPOSABLE) ×3 IMPLANT
TOWEL OR 17X26 10 PK STRL BLUE (TOWEL DISPOSABLE) ×3 IMPLANT
TOWER CARTRIDGE SMART MIX (DISPOSABLE) ×3 IMPLANT
TRAY FOLEY MTR SLVR 16FR STAT (SET/KITS/TRAYS/PACK) ×3 IMPLANT
WATER STERILE IRR 1000ML POUR (IV SOLUTION) ×12 IMPLANT

## 2017-12-27 NOTE — Op Note (Signed)
12/26/2017 - 12/27/2017  5:15 PM  PATIENT:  Erica Arnold   MRN: [id number]  PRE-OPERATIVE DIAGNOSIS: Right femoral neck fracture Mobile lateral hip mass  POST-OPERATIVE DIAGNOSIS:  same  PROCEDURE:  Procedure(s): Right hip  (HEMIARTHROPLASTY) EXCISION  of subcutaneous mass  PREOPERATIVE INDICATIONS:  CAMPBELL AGRAMONTE is an 84 y.o. female who was admitted 12/26/2017 with a diagnosis of right hip fracture and elected for surgical management.  The risks benefits and alternatives were discussed with the patient including but not limited to the risks of nonoperative treatment, versus surgical intervention including infection, bleeding, nerve injury, periprosthetic fracture, the need for revision surgery, dislocation, leg length discrepancy, blood clots, cardiopulmonary complications, morbidity, mortality, among others, and they were willing to proceed.  Predicted outcome is good, although there will be at least a six to nine month expected recovery.   She also had a 82 year old mass over the greater trochanter and elected for removal.    OPERATIVE REPORT     SURGEON:  Marchia Bond, MD    ASSISTANT:  Joya Gaskins, OPA-C  (Present throughout the entire procedure,  necessary for completion of procedure in a timely manner, assisting with retraction, instrumentation, and closure)     ANESTHESIA:  general  ESTIMATED BLOOD LOSS: 119    COMPLICATIONS:  None.   UNIQUE ASPECTS OF THE CASE: The lateral mass was a tophaceous attenuation of fluid and crystal in the bursa lateral to the IT band.  It did not go deeper than that.  The femur sized to between a size 6 and 7, and the 6 was not stable but the 7 would not go down, so I cemented a size 6.  Left leg     COMPONENTS:  [employer] [place] Basic Press Fit Femoral Fracture stem size 6, with a -3 spacer and a fracture head unipolar hip ball.    PROCEDURE IN DETAIL: The patient was met in the holding area and identified.  The appropriate hip  was marked  at the operative site. The patient was then transported to the OR and  placed under anesthesia.  At that point, the patient was  placed in the lateral decubitus position with the operative side up and  secured to the operating room table and all bony prominences padded.     The operative lower extremity was prepped from the iliac crest to the toes.  Sterile draping was performed.  Time out was performed prior to incision.      A routine posterolateral approach was utilized via sharp dissection  carried down to the subcutaneous tissue.   The mass was excised and sent to pathology.  The IT band was intact.    Gross bleeders were Bovie  coagulated.  The iliotibial band was identified and incised  along the length of the skin incision.  Self-retaining retractors were  inserted.  With the hip internally rotated, the short external rotators  were identified. The piriformis was tagged with FiberWire, and the hip capsule released in a T-type fashion.  The femoral neck was exposed, and I resected the femoral neck using the appropriate jig. This was performed at approximately a thumb's breadth above the lesser trochanter.    I then exposed the deep acetabulum, cleared out any tissue including the ligamentum teres, and included the hip capsule in the FiberWire used above and below the T.    I then prepared the proximal femur using the cookie-cutter, the lateralizing reamer, and then sequentially broached.  A trial utilized,  and I reduced the hip and it was found to have excellent stability with functional range of motion.  Initially the 6 was loose after removal, so I tried to get to a 7, but it would not go down so I elected to cement a 6.  The trial components were then removed.   A canal restrictor was placed, size 4, and I cleaned the canal with pulse lavage and the bottle brush and used vacuum technique for cementing.  I then place the real implant, and after the cement cured I impacted the real head  ball into place. The hip was then reduced and taken through functional range of motion and found to have excellent stability. Leg lengths were restored.  I then used a 2 mm drill bits to pass the FiberWire suture from the capsule and piriformis through the greater trochanter, and secured this. Excellent posterior capsular repair was achieved. I also closed the T in the capsule.  I then irrigated the hip copiously again with pulse lavage, and repaired the fascia with Vicryl, followed by Vicryl for the subcutaneous tissue, Monocryl for the skin, Steri-Strips and sterile gauze. The wounds were injected. The patient was then awakened and returned to PACU in stable and satisfactory condition. There were no complications.  Marchia Bond, MD Orthopedic Surgeon [phone]   12/27/2017 5:15 PM

## 2017-12-27 NOTE — Progress Notes (Signed)
PROGRESS NOTE  Erica Arnold YPP:[id number] DOB: [birth year] DOA: 12/26/2017 PCP: Patient, No Pcp Per   LOS: 1 day   Brief Narrative / Interim history: 84 year old female with history of hypertension, COPD, prior TIA who presents to the hospital with right hip fracture after having a mechanical fall.  There is no loss of consciousness, chest pain, palpitations, difficulties breathing.  She remembers the fall.  Denies any fever or chills.  Assessment & Plan: Active Problems:   Essential hypertension   History of TIA (transient ischemic attack)   History of COPD   Thrombocytopenia (HCC)   Subcapital fracture of neck of right femur (HCC)   Right subcapital femoral neck fracture -Orthopedic surgery consulted, plan for operative repair today  Hypertension -Patient intermittently taking metoprolol at home, she does not check her blood pressure regularly but when she gets hand numbness she started taking the metoprolol.  She tells me that she takes her metoprolol for "TIA"  History of TIA -Unable to elaborate too much of the symptoms but as above gets intermittent hand numbness  ?COPD (patient denies) -States that these are allergies -No respiratory complaints, no wheezing, this is stable  Thrombocytopenia -No blood work in the last 9 years that I have access to, I am not sure if this is acute or chronic, will continue to monitor   DVT prophylaxis: SCDs Code Status: DNR Family Communication: Daughter present at bedside Disposition Plan: To be determined,  Consultants:   Orthopedic surgery  Procedures:   None   Antimicrobials:  None    Subjective: - no chest pain, shortness of breath, no abdominal pain, nausea or vomiting.  Complains of hip pain, awaiting surgery.  Objective: Vitals:   12/26/17 1625 12/26/17 1954 12/26/17 2356 12/27/17 0453  BP: (!) 174/84 (!) 172/81 (!) 155/81 (!) 171/79  Pulse:  70 76 77  Resp:  13  17  Temp:  98.6 F (37 C)  98.3 F (36.8 C)   TempSrc:  Oral  Oral  SpO2:  96%  97%  Weight:        Intake/Output Summary (Last 24 hours) at 12/27/2017 1020 Last data filed at 12/27/2017 0454 Gross per 24 hour  Intake -  Output 350 ml  Net -350 ml   Filed Weights   12/26/17 0945  Weight: 44.5 kg (98 lb)    Examination:  Constitutional: NAD Eyes:  lids and conjunctivae normal Respiratory: clear to auscultation bilaterally, no wheezing, no crackles. Normal respiratory effort. Cardiovascular: Regular rate and rhythm, 2/6 systolic ejection murmur. No LE edema. Abdomen: no tenderness. Bowel sounds positive.  Musculoskeletal: no clubbing / cyanosis. Skin: no rashes Neurologic: CN 2-12 grossly intact. Strength 5/5 in all 4.  Cannot do much of the right leg due to pain Psychiatric: Normal judgment and insight. Alert and oriented x 3. Normal mood.    Data Reviewed: I have independently reviewed following labs and imaging studies   CBC: Recent Labs  Lab 12/26/17 1051 12/27/17 0405  WBC 10.9* 9.6  NEUTROABS 10.0*  --   HGB 15.5* 15.3*  HCT 46.8* 45.2  MCV 90.3 90.8  PLT 124* 458*   Basic Metabolic Panel: Recent Labs  Lab 12/26/17 1051 12/27/17 0405  NA 141 139  K 3.7 3.5  CL 103 103  CO2 27 28  GLUCOSE 120* 98  BUN 16 10  CREATININE 0.75 0.60  CALCIUM 9.1 8.8*   GFR: CrCl cannot be calculated (Unknown ideal weight.). Liver Function Tests: No results for input(s): AST, ALT,  ALKPHOS, BILITOT, PROT, ALBUMIN in the last 168 hours. No results for input(s): LIPASE, AMYLASE in the last 168 hours. No results for input(s): AMMONIA in the last 168 hours. Coagulation Profile: Recent Labs  Lab 12/26/17 1051  INR 0.96   Cardiac Enzymes: No results for input(s): CKTOTAL, CKMB, CKMBINDEX, TROPONINI in the last 168 hours. BNP (last 3 results) No results for input(s): PROBNP in the last 8760 hours. HbA1C: No results for input(s): HGBA1C in the last 72 hours. CBG: No results for input(s): GLUCAP in the last 168  hours. Lipid Profile: No results for input(s): CHOL, HDL, LDLCALC, TRIG, CHOLHDL, LDLDIRECT in the last 72 hours. Thyroid Function Tests: No results for input(s): TSH, T4TOTAL, FREET4, T3FREE, THYROIDAB in the last 72 hours. Anemia Panel: No results for input(s): VITAMINB12, FOLATE, FERRITIN, TIBC, IRON, RETICCTPCT in the last 72 hours. Urine analysis:    Component Value Date/Time   COLORURINE YELLOW 12/27/2017 0448   APPEARANCEUR CLEAR 12/27/2017 0448   LABSPEC 1.011 12/27/2017 0448   PHURINE 7.0 12/27/2017 0448   GLUCOSEU NEGATIVE 12/27/2017 0448   HGBUR SMALL (A) 12/27/2017 0448   BILIRUBINUR NEGATIVE 12/27/2017 0448   KETONESUR 5 (A) 12/27/2017 0448   PROTEINUR NEGATIVE 12/27/2017 0448   NITRITE NEGATIVE 12/27/2017 0448   LEUKOCYTESUR NEGATIVE 12/27/2017 0448   Sepsis Labs: Invalid input(s): PROCALCITONIN, LACTICIDVEN  Recent Results (from the past 240 hour(s))  Surgical pcr screen     Status: None   Collection Time: 12/27/17  4:45 AM  Result Value Ref Range Status   MRSA, PCR NEGATIVE NEGATIVE Final   Staphylococcus aureus NEGATIVE NEGATIVE Final    Comment: (NOTE) The Xpert SA Assay (FDA approved for NASAL specimens in patients 5 years of age and older), is one component of a comprehensive surveillance program. It is not intended to diagnose infection nor to guide or monitor treatment. Performed at [facility], [place] [address] [place], [place] [address]       Radiology Studies: Dg Chest 1 View  Result Date: 12/26/2017 CLINICAL DATA:  Recent fall with right hip fracture, initial encounter EXAM: CHEST  1 VIEW COMPARISON:  12/17/2008 FINDINGS: Cardiac shadow is within normal limits. Skin folds are noted bilaterally. The lungs are clear without focal infiltrate. No acute bony abnormality is noted. IMPRESSION: No acute abnormality seen. Electronically Signed   By: Inez Catalina M.D.   On: 12/26/2017 10:49   Dg Hip Unilat With Pelvis 2-3 Views Right  Result  Date: 12/26/2017 CLINICAL DATA:  Recent fall with right hip pain, initial encounter EXAM: DG HIP (WITH OR WITHOUT PELVIS) 3V RIGHT COMPARISON:  None. FINDINGS: Pelvic ring is intact. A the subcapital femoral neck fracture is identified with impaction at the fracture site. Some soft tissue calcification is noted adjacent to the intratrochanteric region. This is of uncertain chronicity. No other focal abnormality is seen IMPRESSION: Right subcapital femoral neck fracture. Soft tissue calcifications are noted adjacent to the greater trochanter. Electronically Signed   By: Inez Catalina M.D.   On: 12/26/2017 10:49     Scheduled Meds: . chlorhexidine  60 mL Topical Once  . metoprolol tartrate  12.5 mg Oral BID  . povidone-iodine  2 application Topical Once   Continuous Infusions: . sodium chloride 75 mL/hr at 12/27/17 0943  .  ceFAZolin (ANCEF) IV    . methocarbamol (ROBAXIN)  IV      Marzetta Board, MD, PhD [facility] Pager [phone]  If 7PM-7AM, please contact night-coverage [email or web address] Password [facility] 12/27/2017,  10:20 AM

## 2017-12-27 NOTE — Anesthesia Preprocedure Evaluation (Addendum)
Anesthesia Evaluation  Patient identified by MRN, date of birth, ID band Patient awake    Reviewed: Allergy & Precautions, NPO status , Patient's Chart, lab work & pertinent test results  Airway Mallampati: II  TM Distance: >3 FB Neck ROM: Full    Dental no notable dental hx.    Pulmonary neg pulmonary ROS, COPD,    Pulmonary exam normal breath sounds clear to auscultation       Cardiovascular hypertension, Pt. on medications and Pt. on home beta blockers negative cardio ROS Normal cardiovascular exam Rhythm:Regular Rate:Normal     Neuro/Psych TIAnegative neurological ROS  negative psych ROS   GI/Hepatic negative GI ROS, Neg liver ROS,   Endo/Other  negative endocrine ROS  Renal/GU negative Renal ROS  negative genitourinary   Musculoskeletal negative musculoskeletal ROS (+)   Abdominal   Peds negative pediatric ROS (+)  Hematology negative hematology ROS (+) Blood dyscrasia (thrombocytopenia), ,   Anesthesia Other Findings   Reproductive/Obstetrics negative OB ROS                            Lab Results  Component Value Date   WBC 9.6 12/27/2017   HGB 15.3 (H) 12/27/2017   HCT 45.2 12/27/2017   MCV 90.8 12/27/2017   PLT 113 (L) 12/27/2017   Lab Results  Component Value Date   CREATININE 0.60 12/27/2017   BUN 10 12/27/2017   NA 139 12/27/2017   K 3.5 12/27/2017   CL 103 12/27/2017   CO2 28 12/27/2017  \  Anesthesia Physical Anesthesia Plan  ASA: III  Anesthesia Plan: General   Post-op Pain Management:    Induction: Intravenous  PONV Risk Score and Plan: 3 and Ondansetron, Dexamethasone and Treatment may vary due to age or medical condition  Airway Management Planned: Oral ETT  Additional Equipment:   Intra-op Plan:   Post-operative Plan: Extubation in OR  Informed Consent: I have reviewed the patients History and Physical, chart, labs and discussed the  procedure including the risks, benefits and alternatives for the proposed anesthesia with the patient or authorized representative who has indicated his/her understanding and acceptance.   Dental advisory given  Plan Discussed with:   Anesthesia Plan Comments:         Anesthesia Quick Evaluation

## 2017-12-27 NOTE — Discharge Instructions (Signed)

## 2017-12-27 NOTE — Anesthesia Postprocedure Evaluation (Signed)
Anesthesia Post Note  Patient: Erica Arnold  Procedure(s) Performed: ARTHROPLASTY BIPOLAR HIP (HEMIARTHROPLASTY) (Right Hip) EXCISION  of subcutaneous mass (Right Hip)     Patient location during evaluation: PACU Anesthesia Type: General Level of consciousness: awake and alert Pain management: pain level controlled Vital Signs Assessment: post-procedure vital signs reviewed and stable Respiratory status: spontaneous breathing, nonlabored ventilation and respiratory function stable Cardiovascular status: blood pressure returned to baseline and stable Postop Assessment: no apparent nausea or vomiting Anesthetic complications: no    Last Vitals:  Vitals:   12/27/17 1917 12/27/17 1932  BP: 139/75 (!) 143/65  Pulse: 76 74  Resp: 11 12  Temp: 36.6 C   SpO2: 93% 93%    Last Pain:  Vitals:   12/27/17 1917  TempSrc:   PainSc: 0-No pain                 Lynda Rainwater

## 2017-12-27 NOTE — Anesthesia Procedure Notes (Signed)
Procedure Name: Intubation Date/Time: 12/27/2017 3:05 PM Performed by: Imagene Riches, CRNA Pre-anesthesia Checklist: Patient identified, Emergency Drugs available, Suction available and Patient being monitored Patient Re-evaluated:Patient Re-evaluated prior to induction Oxygen Delivery Method: Circle System Utilized Preoxygenation: Pre-oxygenation with 100% oxygen Induction Type: IV induction Ventilation: Mask ventilation without difficulty Laryngoscope Size: Miller and 2 Grade View: Grade I Tube type: Oral Tube size: 7.0 mm Number of attempts: 1 Airway Equipment and Method: Stylet and Oral airway Placement Confirmation: ETT inserted through vocal cords under direct vision,  positive ETCO2 and breath sounds checked- equal and bilateral Secured at: 21 cm Tube secured with: Tape Dental Injury: Teeth and Oropharynx as per pre-operative assessment

## 2017-12-27 NOTE — Transfer of Care (Signed)
Immediate Anesthesia Transfer of Care Note  Patient: Erica Arnold  Procedure(s) Performed: ARTHROPLASTY BIPOLAR HIP (HEMIARTHROPLASTY) (Right Hip) EXCISION  of subcutaneous mass (Right Hip)  Patient Location: PACU  Anesthesia Type:General  Level of Consciousness: drowsy and patient cooperative  Airway & Oxygen Therapy: Patient Spontanous Breathing and Patient connected to face mask oxygen  Post-op Assessment: Report given to RN  Post vital signs: Reviewed and stable  Last Vitals:  Vitals Value Taken Time  BP 158/79 12/27/2017  5:47 PM  Temp    Pulse 73 12/27/2017  5:46 PM  Resp 10 12/27/2017  5:46 PM  SpO2 100 % 12/27/2017  5:46 PM  Vitals shown include unvalidated device data.  Last Pain:  Vitals:   12/27/17 0618  TempSrc:   PainSc: Asleep      Patients Stated Pain Goal: 3 (70/26/37 8588)  Complications: No apparent anesthesia complications

## 2017-12-27 NOTE — Progress Notes (Signed)
Patient and family refused taking metoprolol due to the side effects she feels after taking the medication. Explained to pt importance of this medication and patient and family still refused. Myself and Monyca, RN both spoke with pt and family. Pt and family want to wait to take the morning dose.

## 2017-12-27 NOTE — Progress Notes (Signed)
The patient has been re-examined, and the chart reviewed, and there have been no interval changes to the documented history and physical.    The risks, benefits, and alternatives have been discussed at length, and the patient is willing to proceed.    Caitlen Worth P Cavon Nicolls, MD  

## 2017-12-28 ENCOUNTER — Encounter (HOSPITAL_COMMUNITY): Payer: Self-pay | Admitting: Orthopedic Surgery

## 2017-12-28 LAB — BASIC METABOLIC PANEL
ANION GAP: 6 (ref 5–15)
BUN: 14 mg/dL (ref 6–20)
CO2: 26 mmol/L (ref 22–32)
Calcium: 7.8 mg/dL — ABNORMAL LOW (ref 8.9–10.3)
Chloride: 107 mmol/L (ref 101–111)
Creatinine, Ser: 0.91 mg/dL (ref 0.44–1.00)
GFR calc Af Amer: >60 mL/min (ref >60)
GFR, EST NON AFRICAN AMERICAN: 57 mL/min — AB (ref >60)
GLUCOSE: 145 mg/dL — AB (ref 65–99)
POTASSIUM: 5.4 mmol/L — AB (ref 3.5–5.1)
Sodium: 139 mmol/L (ref 135–145)

## 2017-12-28 LAB — CBC
HCT: 40 % (ref 36.0–46.0)
HEMOGLOBIN: 13.3 g/dL (ref 12.0–15.0)
MCH: 30.4 pg (ref 26.0–34.0)
MCHC: 33.3 g/dL (ref 30.0–36.0)
MCV: 91.3 fL (ref 78.0–100.0)
Platelets: 120 10*3/uL — ABNORMAL LOW (ref 150–400)
RBC: 4.38 MIL/uL (ref 3.87–5.11)
RDW: 13 % (ref 11.5–15.5)
WBC: 10.4 10*3/uL (ref 4.0–10.5)

## 2017-12-28 LAB — RETICULOCYTES
RBC.: 4.38 MIL/uL (ref 3.87–5.11)
RETIC COUNT ABSOLUTE: 39.4 10*3/uL (ref 19.0–186.0)
Retic Ct Pct: 0.9 % (ref 0.4–3.1)

## 2017-12-28 LAB — VITAMIN B12: VITAMIN B 12: 596 pg/mL (ref 180–914)

## 2017-12-28 LAB — IRON AND TIBC
Iron: 30 ug/dL (ref 28–170)
Saturation Ratios: 14 % (ref 10.4–31.8)
TIBC: 216 ug/dL — ABNORMAL LOW (ref 250–450)
UIBC: 186 ug/dL

## 2017-12-28 LAB — FOLATE: FOLATE: 17.8 ng/mL (ref >5.9)

## 2017-12-28 LAB — FERRITIN: Ferritin: 137 ng/mL (ref 11–307)

## 2017-12-28 NOTE — Progress Notes (Addendum)
Patient ID: Erica Arnold, female   DOB: 11/29/1931, 84 y.o.   MRN: [id number]     Subjective:  Patient reports pain as mild.  Patient in bed and in no acute distress.  Denies any CP or SOB  Objective:   VITALS:   Vitals:   12/27/17 2200 12/28/17 0123 12/28/17 0336 12/28/17 0830  BP: (!) 162/71 117/71 120/63 (!) 110/53  Pulse: 79 80 77 84  Resp: 16 16 16    Temp: (!) 97.4 F (36.3 C) 97.7 F (36.5 C) 97.9 F (36.6 C) 97.9 F (36.6 C)  TempSrc: Axillary Oral Oral Oral  SpO2: 94% 94% 93% 95%  Weight:      Height:        ABD soft Sensation intact distally Dorsiflexion/Plantar flexion intact Incision: dressing C/D/I and no drainage   Lab Results  Component Value Date   WBC 10.4 12/28/2017   HGB 13.3 12/28/2017   HCT 40.0 12/28/2017   MCV 91.3 12/28/2017   PLT 120 (L) 12/28/2017   BMET    Component Value Date/Time   NA 139 12/28/2017 0408   K 5.4 (H) 12/28/2017 0408   CL 107 12/28/2017 0408   CO2 26 12/28/2017 0408   GLUCOSE 145 (H) 12/28/2017 0408   BUN 14 12/28/2017 0408   CREATININE 0.91 12/28/2017 0408   CALCIUM 7.8 (L) 12/28/2017 0408   GFRNONAA 57 (L) 12/28/2017 0408   GFRAA >60 12/28/2017 0408     Assessment/Plan: 1 Day Post-Op   Active Problems:   Essential hypertension   History of TIA (transient ischemic attack)   History of COPD   Thrombocytopenia (HCC)   Subcapital fracture of neck of right femur (HCC)   Advance diet Up with therapy WBAT Dry dressing PRN Plan for snf    Remonia Richter 12/28/2017, 8:53 AM  Discussed and agree with above.  Lovenox reduced dose, WBAT, posterior precautions.  Lives alone, will likely need SNF.  Marchia Bond, MD Cell [phone]

## 2017-12-28 NOTE — NC FL2 (Signed)
[place] MEDICAID FL2 LEVEL OF CARE SCREENING TOOL     IDENTIFICATION  Patient Name: Erica Arnold Birthdate: 13-Apr-1932 Sex: female Admission Date (Current Location): 12/26/2017  [facility] and [place] Number:  Herbalist and Address:  [facility] [place]. [facility], [place] [address], [place], [place] [address]      Provider Number: [id number]  Attending Physician Name and Address:  Caren Griffins, MD  Relative Name and Phone Number:  [name], daughter,  [phone]    Current Level of Care: Hospital Recommended Level of Care: [place] Prior Approval Number:    Date Approved/Denied:   PASRR Number: [id number]  Discharge Plan: SNF    Current Diagnoses: Patient Active Problem List   Diagnosis Date Noted  . History of TIA (transient ischemic attack) 12/26/2017  . History of COPD 12/26/2017  . Thrombocytopenia ([place]) 12/26/2017  . Subcapital fracture of neck of right femur ([place]) 12/26/2017  . Essential hypertension 12/23/2008  . COPD 12/23/2008  . DYSPNEA 12/23/2008    Orientation RESPIRATION BLADDER Height & Weight     Place, Self  Normal Continent Weight: 98 lb (44.5 kg) Height:  5\' 4"  (162.6 cm)  BEHAVIORAL SYMPTOMS/MOOD NEUROLOGICAL BOWEL NUTRITION STATUS      Continent Diet(See DC Summary)  AMBULATORY STATUS COMMUNICATION OF NEEDS Skin   Limited Assist Verbally Surgical wounds                       Personal Care Assistance Level of Assistance  Bathing, Feeding, Dressing Bathing Assistance: Maximum assistance Feeding assistance: Limited assistance Dressing Assistance: Maximum assistance     Functional Limitations Info  Sight, Hearing, Speech Sight Info: Adequate Hearing Info: Adequate Speech Info: Adequate    SPECIAL CARE FACTORS FREQUENCY  PT (By licensed PT), OT (By licensed OT)     PT Frequency: 3x week OT Frequency: 3x week            Contractures      Additional Factors Info  Code  Status, Allergies Code Status Info: DNR Allergies Info: DOXYCYCLINE, TETANUS TOXOIDS, OTHER            Current Medications (12/28/2017):  This is the current hospital active medication list Current Facility-Administered Medications  Medication Dose Route Frequency Provider Last Rate Last Dose  . acetaminophen (TYLENOL) tablet 325-650 mg  325-650 mg Oral Q6H PRN Marchia Bond, MD      . acetaminophen (TYLENOL) tablet 500 mg  500 mg Oral Q6H Marchia Bond, MD   500 mg at 12/28/17 1251  . alum & mag hydroxide-simeth (MAALOX/MYLANTA) 200-200-20 MG/5ML suspension 30 mL  30 mL Oral Q4H PRN Marchia Bond, MD      . bisacodyl (DULCOLAX) suppository 10 mg  10 mg Rectal Daily PRN Marchia Bond, MD      . docusate sodium (COLACE) capsule 100 mg  100 mg Oral BID Marchia Bond, MD   100 mg at 12/28/17 0958  . enoxaparin (LOVENOX) injection 30 mg  30 mg Subcutaneous Q24H Marchia Bond, MD   30 mg at 12/28/17 0959  . ferrous sulfate tablet 325 mg  325 mg Oral TID Emiliano Dyer, MD   325 mg at 12/28/17 1251  . HYDROcodone-acetaminophen (NORCO/VICODIN) 5-325 MG per tablet 1-2 tablet  1-2 tablet Oral Q4H PRN Marchia Bond, MD      . ketorolac (TORADOL) 15 MG/ML injection 7.5 mg  7.5 mg Intravenous Q6H Marchia Bond, MD   7.5 mg at 12/28/17 1250  .  magnesium citrate solution 1 Bottle  1 Bottle Oral Once PRN Marchia Bond, MD      . menthol-cetylpyridinium (CEPACOL) lozenge 3 mg  1 lozenge Oral PRN Marchia Bond, MD       Or  . phenol (CHLORASEPTIC) mouth spray 1 spray  1 spray Mouth/Throat PRN Marchia Bond, MD      . metoCLOPramide (REGLAN) tablet 5-10 mg  5-10 mg Oral Q8H PRN Marchia Bond, MD       Or  . metoCLOPramide (REGLAN) injection 5-10 mg  5-10 mg Intravenous Q8H PRN Marchia Bond, MD      . metoprolol tartrate (LOPRESSOR) tablet 12.5 mg  12.5 mg Oral BID Marchia Bond, MD   12.5 mg at 12/28/17 0958  . morphine 2 MG/ML injection 0.5-1 mg  0.5-1 mg Intravenous Q2H PRN Marchia Bond, MD      . ondansetron [facility]) tablet 4 mg  4 mg Oral Q6H PRN Marchia Bond, MD       Or  . ondansetron [facility]) injection 4 mg  4 mg Intravenous Q6H PRN Marchia Bond, MD      . polyethylene glycol (MIRALAX / GLYCOLAX) packet 17 g  17 g Oral Daily PRN Marchia Bond, MD         Discharge Medications: Please see discharge summary for a list of discharge medications.  Relevant Imaging Results:  Relevant Lab Results:   Additional Information SS#: [id number]  Normajean Baxter, LCSW

## 2017-12-28 NOTE — Clinical Social Work Note (Addendum)
Clinical Social Work Assessment  Patient Details  Name: Erica Arnold MRN: [id number] Date of Birth: 13-Apr-1932  Date of referral:  12/28/17               Reason for consult:  Facility Placement                Permission sought to share information with:  Facility Art therapist granted to share information::  Yes, Verbal Permission Granted  Name::     [place]::  SNF  Relationship::  daughter,family  Contact Information:     Housing/Transportation Living arrangements for the past 2 months:  Single Family Home Source of Information:  Adult Children Patient Interpreter Needed:  None Criminal Activity/Legal Involvement Pertinent to Current Situation/Hospitalization:  No - Comment as needed Significant Relationships:  Adult Children, Other Family Members Lives with:  Self Do you feel safe going back to the place where you live?  No Need for family participation in patient care:  Yes (Comment)  Care giving concerns:  Pt resides alone and will need skilled nursing before returning home to independence.  Social Worker assessment / plan:  CSW met with daughter [place] and Prentiss Bells at bedside. Pt was resting throughout meeting. Per daughter, pt resided alone and ambulated independently prior to impairment. Pt has no experience with SNF. CSW explained role, SNF placement and process. CSW answered all questions.  CSW obtained permission to send to SNF's in [place] to see who can offer a SNF bed.   SW will f/u for disposition.  Employment status:  Retired Nurse, adult PT Recommendations:  [place] / Referral to community resources:  [place]  Patient/Family's Response to care:  Family appreciative of [place] meeting to discuss disposition. Family agreeable to SNF placement.  Patient/Family's Understanding of and Emotional Response to Diagnosis, Current Treatment, and Prognosis:  Family  understands impairment and agreeable to SNF. Pt was resting when CSW met at bedside. The family hopes patient will improve with therapy briefly and return home to her independence. No issues or concerns identified. CSW will assist with disposition.  Emotional Assessment Appearance:  Appears stated age Attitude/Demeanor/Rapport:  Unable to Assess Affect (typically observed):  Unable to Assess, Quiet Orientation:  Oriented to Self, Oriented to Place Alcohol / Substance use:  Not Applicable Psych involvement (Current and /or in the community):  No (Comment)  Discharge Needs  Concerns to be addressed:  Discharge Planning Concerns Readmission within the last 30 days:  No Current discharge risk:  Dependent with Mobility, Physical Impairment, Lives alone Barriers to Discharge:  No Barriers Identified   Erica Baxter, LCSW 12/28/2017, 4:12 PM

## 2017-12-28 NOTE — Progress Notes (Signed)
PROGRESS NOTE  Erica Arnold IDP:[id number] DOB: 04-17-1932 DOA: 12/26/2017 PCP: Patient, No Pcp Per   LOS: 2 days   Brief Narrative / Interim history: 84 year old female with history of hypertension, COPD, prior TIA who presents to the hospital with right hip fracture after having a mechanical fall.  There is no loss of consciousness, chest pain, palpitations, difficulties breathing.  She remembers the fall.  Denies any fever or chills.  Assessment & Plan: Active Problems:   Essential hypertension   History of TIA (transient ischemic attack)   History of COPD   Thrombocytopenia (HCC)   Subcapital fracture of neck of right femur (HCC)   Right subcapital femoral neck fracture -Orthopedic surgery consulted, status post right hip hemiarthroplasty on 5/16 by Dr. Mardelle Matte -This morning minimal pain while laying in bed, has not worked with PT just yet  Hypertension -Patient intermittently taking metoprolol at home, she does not check her blood pressure regularly but when she gets hand numbness she started taking the metoprolol.  She tells me that she takes her metoprolol for "TIA" -Blood pressure within normal limits, continue metoprolol  Hyperkalemia -Due to IV fluids, these were discontinued this morning, will check tomorrow morning  History of TIA -Unable to elaborate too much of the symptoms but as above gets intermittent hand numbness -No symptoms currently  ?COPD (patient denies) -States that these are allergies -No respiratory complaints, no wheezing, this is stable  Thrombocytopenia -No blood work in the last 9 years that I have access to, I am not sure if this is acute or chronic, will continue to monitor -Platelets improving today -No evidence of B12 deficiency   DVT prophylaxis: SCDs Code Status: DNR Family Communication: Daughter present at bedside Disposition Plan: To be determined,  Consultants:   Orthopedic surgery  Procedures:   None    Antimicrobials:  None    Subjective: -Feeling well, wants to get up on her own and somewhat anxious.  No chest pain, shortness of breath.  No palpitations.  No abdominal pain, nausea or vomiting  Objective: Vitals:   12/28/17 0123 12/28/17 0336 12/28/17 0830 12/28/17 1214  BP: 117/71 120/63 (!) 110/53 (!) 106/57  Pulse: 80 77 84 79  Resp: 16 16    Temp: 97.7 F (36.5 C) 97.9 F (36.6 C) 97.9 F (36.6 C) 98.4 F (36.9 C)  TempSrc: Oral Oral Oral Oral  SpO2: 94% 93% 95% 96%  Weight:      Height:        Intake/Output Summary (Last 24 hours) at 12/28/2017 1314 Last data filed at 12/28/2017 1147 Gross per 24 hour  Intake 1825 ml  Output 570 ml  Net 1255 ml   Filed Weights   12/26/17 0945 12/27/17 1245  Weight: 44.5 kg (98 lb) 44.5 kg (98 lb)    Examination:  Constitutional: No distress, pleasant elderly female Eyes: No scleral icterus Respiratory: Clear to auscultation bilaterally without wheezing or crackles. Cardiovascular: Regular rate and rhythm, 2/6 SEM, no edema Abdomen: Soft, nontender, nondistended.  Bowel sounds positive Skin: No rashes seen Neurologic: Nonfocal, moves all 4 extremities Psychiatric: Normal judgment and insight. Alert and oriented x 3. Normal mood.    Data Reviewed: I have independently reviewed following labs and imaging studies   CBC: Recent Labs  Lab 12/26/17 1051 12/27/17 0405 12/28/17 0408  WBC 10.9* 9.6 10.4  NEUTROABS 10.0*  --   --   HGB 15.5* 15.3* 13.3  HCT 46.8* 45.2 40.0  MCV 90.3 90.8 91.3  PLT  124* 113* 683*   Basic Metabolic Panel: Recent Labs  Lab 12/26/17 1051 12/27/17 0405 12/28/17 0408  NA 141 139 139  K 3.7 3.5 5.4*  CL 103 103 107  CO2 27 28 26   GLUCOSE 120* 98 145*  BUN 16 10 14   CREATININE 0.75 0.60 0.91  CALCIUM 9.1 8.8* 7.8*   GFR: Estimated Creatinine Clearance: 32.9 mL/min (by C-G formula based on SCr of 0.91 mg/dL). Liver Function Tests: No results for input(s): AST, ALT, ALKPHOS,  BILITOT, PROT, ALBUMIN in the last 168 hours. No results for input(s): LIPASE, AMYLASE in the last 168 hours. No results for input(s): AMMONIA in the last 168 hours. Coagulation Profile: Recent Labs  Lab 12/26/17 1051  INR 0.96   Cardiac Enzymes: No results for input(s): CKTOTAL, CKMB, CKMBINDEX, TROPONINI in the last 168 hours. BNP (last 3 results) No results for input(s): PROBNP in the last 8760 hours. HbA1C: No results for input(s): HGBA1C in the last 72 hours. CBG: Recent Labs  Lab 12/27/17 2135  GLUCAP 139*   Lipid Profile: No results for input(s): CHOL, HDL, LDLCALC, TRIG, CHOLHDL, LDLDIRECT in the last 72 hours. Thyroid Function Tests: No results for input(s): TSH, T4TOTAL, FREET4, T3FREE, THYROIDAB in the last 72 hours. Anemia Panel: Recent Labs    12/28/17 0408  VITAMINB12 596  FOLATE 17.8  FERRITIN 137  TIBC 216*  IRON 30  RETICCTPCT 0.9   Urine analysis:    Component Value Date/Time   COLORURINE YELLOW 12/27/2017 0448   APPEARANCEUR CLEAR 12/27/2017 0448   LABSPEC 1.011 12/27/2017 0448   PHURINE 7.0 12/27/2017 0448   GLUCOSEU NEGATIVE 12/27/2017 0448   HGBUR SMALL (A) 12/27/2017 0448   BILIRUBINUR NEGATIVE 12/27/2017 0448   KETONESUR 5 (A) 12/27/2017 0448   PROTEINUR NEGATIVE 12/27/2017 0448   NITRITE NEGATIVE 12/27/2017 0448   LEUKOCYTESUR NEGATIVE 12/27/2017 0448   Sepsis Labs: Invalid input(s): PROCALCITONIN, LACTICIDVEN  Recent Results (from the past 240 hour(s))  Surgical pcr screen     Status: None   Collection Time: 12/27/17  4:45 AM  Result Value Ref Range Status   MRSA, PCR NEGATIVE NEGATIVE Final   Staphylococcus aureus NEGATIVE NEGATIVE Final    Comment: (NOTE) The Xpert SA Assay (FDA approved for NASAL specimens in patients 29 years of age and older), is one component of a comprehensive surveillance program. It is not intended to diagnose infection nor to guide or monitor treatment. Performed at [facility], [place]  [address] [place], [place] [address]       Radiology Studies: Dg Hip Port Unilat With Pelvis 1v Right  Result Date: 12/27/2017 CLINICAL DATA:  Status post ORIF.  Hip fracture. EXAM: DG HIP (WITH OR WITHOUT PELVIS) 1V PORT RIGHT COMPARISON:  Dec 26, 2017 FINDINGS: Patient is status post right hip replacement. Hardware is in good position. Postoperative air is seen in the soft tissues. Vascular calcifications. IMPRESSION: Postoperative changes in the right hip as above. Electronically Signed   By: Dorise Bullion III M.D   On: 12/27/2017 18:39     Scheduled Meds: . acetaminophen  500 mg Oral Q6H  . docusate sodium  100 mg Oral BID  . enoxaparin (LOVENOX) injection  30 mg Subcutaneous Q24H  . ferrous sulfate  325 mg Oral TID PC  . ketorolac  7.5 mg Intravenous Q6H  . metoprolol tartrate  12.5 mg Oral BID   Continuous Infusions:   Marzetta Board, MD, PhD [facility] Pager [phone]  If 7PM-7AM, please contact  night-coverage [email or web address] Password [email or web address] 12/28/2017, 1:14 PM

## 2017-12-28 NOTE — Evaluation (Signed)
Physical Therapy Evaluation Patient Details Name: Erica Arnold MRN: [id number] DOB: 1932/07/12 Today's Date: 12/28/2017   History of Present Illness  Pt is an 84 y/o female who presents s/p mechanical fall at home. Pt sustained a right subcapital femoral neck fracture and is now s/p R hip hemiarthroplasty, posterior approach, and is WBAT. No significant PMH noted.    Clinical Impression  Pt admitted with above diagnosis. Pt currently with functional limitations due to the deficits listed below (see PT Problem List). At the time of PT eval pt was able to perform transfers and ambulation with gross min guard assist to minimal assist for balance support and safety with the RW. Discussed plan for d/c and pt very vocal about wanting to go home if at all possible. Family is wanting SNF for the pt but daughter-in-law states they could be present 24 hours if need be. I feel pt is likely mobilizing well enough to return home (with 24 hour assist), however the increased frequency of therapies at SNF would allow for a quicker return to PLOF. Pt also appeared a little confused at times, and would need 24 hour assist for safety. Acutely, pt will benefit from skilled PT to increase their independence and safety with mobility to allow discharge to the venue listed below.      Follow Up Recommendations SNF;Supervision/Assistance - 24 hour    Equipment Recommendations  None recommended by PT(TBD by next venue of care)    Recommendations for Other Services OT consult     Precautions / Restrictions Precautions Precautions: Fall;Posterior Hip Precaution Booklet Issued: Yes (comment) Precaution Comments: Pt and family were educated on posterior hip precautions. Restrictions Weight Bearing Restrictions: Yes RLE Weight Bearing: Weight bearing as tolerated      Mobility  Bed Mobility Overal bed mobility: Needs Assistance Bed Mobility: Supine to Sit     Supine to sit: Min guard     General bed  mobility comments: Pt was able to transition to EOB without assistance. Close guard for safety. Pt was cued to maintain hip precautions throughout.   Transfers Overall transfer level: Needs assistance Equipment used: Rolling walker (2 wheeled) Transfers: Sit to/from Stand Sit to Stand: Min assist         General transfer comment: Assist for balance support as pt powered up to full stand. VC's for hand placement on seated surface for safety.   Ambulation/Gait Ambulation/Gait assistance: Min guard Ambulation Distance (Feet): 40 Feet Assistive device: Rolling walker (2 wheeled) Gait Pattern/deviations: Step-through pattern;Decreased stride length;Trunk flexed Gait velocity: Decreased Gait velocity interpretation: <1.8 ft/sec, indicate of risk for recurrent falls General Gait Details: VC's for improved posture and general safety with the RW. Pt fearful of falling and the RLE giving out on her however no over LOB noted. She was able to ambulate out to the hallway and back to the recliner with min guard assist.   Stairs            Wheelchair Mobility    Modified Rankin (Stroke Patients Only)       Balance Overall balance assessment: Needs assistance Sitting-balance support: Feet supported;No upper extremity supported Sitting balance-Leahy Scale: Good     Standing balance support: During functional activity;Bilateral upper extremity supported Standing balance-Leahy Scale: Poor Standing balance comment: Reliant on UE support                             Pertinent Vitals/Pain Pain Assessment: 0-10 Pain  Score: 0-No pain Pain Intervention(s): Monitored during session    [place] expects to be discharged to:: Private residence Living Arrangements: Alone Available Help at Discharge: Family;Available 24 hours/day Type of Home: House Home Access: Stairs to enter   [employer] of Steps: 2 Home Layout: One level Home Equipment:  Walker - 2 wheels;Cane - single point;Shower seat - built in      Prior Function Level of Independence: Independent         Comments: Does not drive, family gets groceries for pt, she is able to do all bathing/dressing/house chores     Hand Dominance        Extremity/Trunk Assessment   Upper Extremity Assessment Upper Extremity Assessment: Defer to OT evaluation    Lower Extremity Assessment Lower Extremity Assessment: RLE deficits/detail RLE Deficits / Details: Decreased strength and AROM consistent with above mentioned procedure.     Cervical / Trunk Assessment Cervical / Trunk Assessment: Kyphotic  Communication   Communication: HOH  Cognition Arousal/Alertness: Awake/alert Behavior During Therapy: Flat affect Overall Cognitive Status: Impaired/Different from baseline Area of Impairment: Attention;Memory;Safety/judgement;Awareness;Problem solving                   Current Attention Level: Sustained Memory: Decreased short-term memory;Decreased recall of precautions   Safety/Judgement: Decreased awareness of safety;Decreased awareness of deficits Awareness: Emergent Problem Solving: Slow processing;Decreased initiation;Requires verbal cues General Comments: Pt oriented however not answering questions with relevant information during history. Family did not mention that this was different from normal and made no attempt to correct information.       General Comments      Exercises General Exercises - Lower Extremity Ankle Circles/Pumps: 10 reps Quad Sets: 10 reps   Assessment/Plan    PT Assessment Patient needs continued PT services  PT Problem List Decreased strength;Decreased range of motion;Decreased activity tolerance;Decreased balance;Decreased mobility;Decreased knowledge of use of DME;Decreased safety awareness;Decreased knowledge of precautions;Pain       PT Treatment Interventions DME instruction;Gait training;Stair training;Functional  mobility training;Therapeutic activities;Therapeutic exercise;Neuromuscular re-education;Patient/family education    PT Goals (Current goals can be found in the Care Plan section)  Acute Rehab PT Goals Patient Stated Goal: Pt wants to go home. Not SNF PT Goal Formulation: With patient/family Time For Goal Achievement: 01/11/18 Potential to Achieve Goals: Good    Frequency Min 3X/week   Barriers to discharge        Co-evaluation               AM-PAC PT "6 Clicks" Daily Activity  Outcome Measure Difficulty turning over in bed (including adjusting bedclothes, sheets and blankets)?: Unable Difficulty moving from lying on back to sitting on the side of the bed? : Unable Difficulty sitting down on and standing up from a chair with arms (e.g., wheelchair, bedside commode, etc,.)?: Unable Help needed moving to and from a bed to chair (including a wheelchair)?: A Little Help needed walking in hospital room?: A Little Help needed climbing 3-5 steps with a railing? : A Lot 6 Click Score: 11    End of Session Equipment Utilized During Treatment: Gait belt Activity Tolerance: Patient tolerated treatment well Patient left: in chair;with call bell/phone within reach;with family/visitor present;with chair alarm set Nurse Communication: Mobility status PT Visit Diagnosis: Unsteadiness on feet (R26.81);Pain;Difficulty in walking, not elsewhere classified (R26.2) Pain - Right/Left: Right Pain - part of body: Hip    Time: 3474-2595 PT Time Calculation (min) (ACUTE ONLY): 33 min   Charges:   PT  Evaluation $PT Eval Moderate Complexity: 1 Mod PT Treatments $Gait Training: 8-22 mins   PT G Codes:        Rolinda Roan, PT, DPT Acute Rehabilitation Services Pager: [phone]   Thelma Comp 12/28/2017, 3:26 PM

## 2017-12-29 LAB — CBC
HEMATOCRIT: 32.5 % — AB (ref 36.0–46.0)
HEMOGLOBIN: 10.8 g/dL — AB (ref 12.0–15.0)
MCH: 30.3 pg (ref 26.0–34.0)
MCHC: 33.2 g/dL (ref 30.0–36.0)
MCV: 91 fL (ref 78.0–100.0)
Platelets: 98 10*3/uL — ABNORMAL LOW (ref 150–400)
RBC: 3.57 MIL/uL — ABNORMAL LOW (ref 3.87–5.11)
RDW: 13.2 % (ref 11.5–15.5)
WBC: 9 10*3/uL (ref 4.0–10.5)

## 2017-12-29 LAB — BASIC METABOLIC PANEL
Anion gap: 6 (ref 5–15)
BUN: 20 mg/dL (ref 6–20)
CHLORIDE: 107 mmol/L (ref 101–111)
CO2: 28 mmol/L (ref 22–32)
Calcium: 8.3 mg/dL — ABNORMAL LOW (ref 8.9–10.3)
Creatinine, Ser: 0.82 mg/dL (ref 0.44–1.00)
GFR calc Af Amer: >60 mL/min (ref >60)
GFR calc non Af Amer: >60 mL/min (ref >60)
GLUCOSE: 93 mg/dL (ref 65–99)
Potassium: 4 mmol/L (ref 3.5–5.1)
SODIUM: 141 mmol/L (ref 135–145)

## 2017-12-29 NOTE — Progress Notes (Signed)
PROGRESS NOTE  Erica Arnold WJX:[id number] DOB: 20-Sep-1931 DOA: 12/26/2017 PCP: Patient, No Pcp Per   LOS: 3 days   Brief Narrative / Interim history: 84 year old female with history of hypertension, COPD, prior TIA who presents to the hospital with right hip fracture after having a mechanical fall.  There is no loss of consciousness, chest pain, palpitations, difficulties breathing.  She remembers the fall.  Denies any fever or chills.  Assessment & Plan: Active Problems:   Essential hypertension   History of TIA (transient ischemic attack)   History of COPD   Thrombocytopenia (HCC)   Subcapital fracture of neck of right femur (HCC)   Right subcapital femoral neck fracture -Orthopedic surgery consulted, status post right hip hemiarthroplasty on 5/16 by Dr. Mardelle Matte -Pain controlled, she took Tylenol and that seems to help. -Worked with physical therapy last night, able to ambulate, recommending SNF to which patient and daughter are agreeable -Consulted social worker  Hypertension -Patient intermittently taking metoprolol at home, she does not check her blood pressure regularly but when she gets hand numbness she started taking the metoprolol.  She tells me that she takes her metoprolol for "TIA" -Continue metoprolol  Hyperkalemia -Due to IV fluids, discontinued on 5/17, potassium within normal limits this morning  Acute blood loss anemia -Hemoglobin dropped to 10.8 from 15 on admission, no need for transfusion we will continue to monitor  History of TIA -Unable to elaborate too much of the symptoms but as above gets intermittent hand numbness -No symptoms currently  Thrombocytopenia -No blood work in the last 9 years that I have access to, I am not sure if this is acute or chronic, will continue to monitor -Platelets are stable   DVT prophylaxis: SCDs Code Status: DNR Family Communication: Daughter present at bedside Disposition Plan: SNF 1-2 days when bed available    Consultants:   Orthopedic surgery  Procedures:   None   Antimicrobials:  None    Subjective: -She is feeling well this morning, anxious about all the situation and SNF. No chest pain, no nausea/vomiting  Objective: Vitals:   12/28/17 0830 12/28/17 1214 12/28/17 2200 12/29/17 0623  BP: (!) 110/53 (!) 106/57 (!) 129/55 (!) 155/76  Pulse: 84 79 95 96  Resp:   16 18  Temp: 97.9 F (36.6 C) 98.4 F (36.9 C) 98.7 F (37.1 C) 98.3 F (36.8 C)  TempSrc: Oral Oral Oral Oral  SpO2: 95% 96% 93% 93%  Weight:      Height:        Intake/Output Summary (Last 24 hours) at 12/29/2017 1320 Last data filed at 12/29/2017 2130 Gross per 24 hour  Intake 240 ml  Output 650 ml  Net -410 ml   Filed Weights   12/26/17 0945 12/27/17 1245  Weight: 44.5 kg (98 lb) 44.5 kg (98 lb)    Examination:  Constitutional: No distress, Eyes: No scleral icterus Respiratory: Clear to auscultation bilaterally, no wheezing, no crackles, moves air well Cardiovascular: Regular rate and rhythm, 2/6 SEM, no peripheral edema Abdomen: Positive bowel sounds, abdomen is soft without tenderness Neurologic: No focal findings Psychiatric: Alert and oriented x3   Data Reviewed: I have independently reviewed following labs and imaging studies   CBC: Recent Labs  Lab 12/26/17 1051 12/27/17 0405 12/28/17 0408 12/29/17 0539  WBC 10.9* 9.6 10.4 9.0  NEUTROABS 10.0*  --   --   --   HGB 15.5* 15.3* 13.3 10.8*  HCT 46.8* 45.2 40.0 32.5*  MCV 90.3 90.8 91.3  91.0  PLT 124* 113* 120* 98*   Basic Metabolic Panel: Recent Labs  Lab 12/26/17 1051 12/27/17 0405 12/28/17 0408 12/29/17 0539  NA 141 139 139 141  K 3.7 3.5 5.4* 4.0  CL 103 103 107 107  CO2 27 28 26 28   GLUCOSE 120* 98 145* 93  BUN 16 10 14 20   CREATININE 0.75 0.60 0.91 0.82  CALCIUM 9.1 8.8* 7.8* 8.3*   GFR: Estimated Creatinine Clearance: 36.5 mL/min (by C-G formula based on SCr of 0.82 mg/dL). Liver Function Tests: No results for  input(s): AST, ALT, ALKPHOS, BILITOT, PROT, ALBUMIN in the last 168 hours. No results for input(s): LIPASE, AMYLASE in the last 168 hours. No results for input(s): AMMONIA in the last 168 hours. Coagulation Profile: Recent Labs  Lab 12/26/17 1051  INR 0.96   Cardiac Enzymes: No results for input(s): CKTOTAL, CKMB, CKMBINDEX, TROPONINI in the last 168 hours. BNP (last 3 results) No results for input(s): PROBNP in the last 8760 hours. HbA1C: No results for input(s): HGBA1C in the last 72 hours. CBG: Recent Labs  Lab 12/27/17 2135  GLUCAP 139*   Lipid Profile: No results for input(s): CHOL, HDL, LDLCALC, TRIG, CHOLHDL, LDLDIRECT in the last 72 hours. Thyroid Function Tests: No results for input(s): TSH, T4TOTAL, FREET4, T3FREE, THYROIDAB in the last 72 hours. Anemia Panel: Recent Labs    12/28/17 0408  VITAMINB12 596  FOLATE 17.8  FERRITIN 137  TIBC 216*  IRON 30  RETICCTPCT 0.9   Urine analysis:    Component Value Date/Time   COLORURINE YELLOW 12/27/2017 0448   APPEARANCEUR CLEAR 12/27/2017 0448   LABSPEC 1.011 12/27/2017 0448   PHURINE 7.0 12/27/2017 0448   GLUCOSEU NEGATIVE 12/27/2017 0448   HGBUR SMALL (A) 12/27/2017 0448   BILIRUBINUR NEGATIVE 12/27/2017 0448   KETONESUR 5 (A) 12/27/2017 0448   PROTEINUR NEGATIVE 12/27/2017 0448   NITRITE NEGATIVE 12/27/2017 0448   LEUKOCYTESUR NEGATIVE 12/27/2017 0448   Sepsis Labs: Invalid input(s): PROCALCITONIN, LACTICIDVEN  Recent Results (from the past 240 hour(s))  Surgical pcr screen     Status: None   Collection Time: 12/27/17  4:45 AM  Result Value Ref Range Status   MRSA, PCR NEGATIVE NEGATIVE Final   Staphylococcus aureus NEGATIVE NEGATIVE Final    Comment: (NOTE) The Xpert SA Assay (FDA approved for NASAL specimens in patients 18 years of age and older), is one component of a comprehensive surveillance program. It is not intended to diagnose infection nor to guide or monitor treatment. Performed at [facility], [place] [address], [place], [place] [address]       Radiology Studies: Dg Hip Port Unilat With Pelvis 1v Right  Result Date: 12/27/2017 CLINICAL DATA:  Status post ORIF.  Hip fracture. EXAM: DG HIP (WITH OR WITHOUT PELVIS) 1V PORT RIGHT COMPARISON:  Dec 26, 2017 FINDINGS: Patient is status post right hip replacement. Hardware is in good position. Postoperative air is seen in the soft tissues. Vascular calcifications. IMPRESSION: Postoperative changes in the right hip as above. Electronically Signed   By: Dorise Bullion III M.D   On: 12/27/2017 18:39     Scheduled Meds: . docusate sodium  100 mg Oral BID  . enoxaparin (LOVENOX) injection  30 mg Subcutaneous Q24H  . ferrous sulfate  325 mg Oral TID PC  . metoprolol tartrate  12.5 mg Oral BID   Continuous Infusions:   Marzetta Board, MD, PhD [facility] Pager [phone]  If 7PM-7AM, please contact night-coverage [email or web address]  Password [email or web address] 12/29/2017, 1:20 PM

## 2017-12-29 NOTE — Progress Notes (Signed)
Patient ID: Erica Arnold, female   DOB: 04-17-1932, 84 y.o.   MRN: [id number]     Subjective:  Patient reports pain as mild.  Son at bedside.  Denies any CP or SOB.  Mobilizing well w/ PT.  Objective:   VITALS:   Vitals:   12/28/17 0830 12/28/17 1214 12/28/17 2200 12/29/17 0623  BP: (!) 110/53 (!) 106/57 (!) 129/55 (!) 155/76  Pulse: 84 79 95 96  Resp:   16 18  Temp: 97.9 F (36.6 C) 98.4 F (36.9 C) 98.7 F (37.1 C) 98.3 F (36.8 C)  TempSrc: Oral Oral Oral Oral  SpO2: 95% 96% 93% 93%  Weight:      Height:       General: NAD.  Upright in bed eating lunch.  Calm, conversant. MSK Sensation intact distally Dorsiflexion/Plantar flexion intact Incision: dressing C/D/I and no drainage   Lab Results  Component Value Date   WBC 9.0 12/29/2017   HGB 10.8 (L) 12/29/2017   HCT 32.5 (L) 12/29/2017   MCV 91.0 12/29/2017   PLT 98 (L) 12/29/2017   BMET    Component Value Date/Time   NA 141 12/29/2017 0539   K 4.0 12/29/2017 0539   CL 107 12/29/2017 0539   CO2 28 12/29/2017 0539   GLUCOSE 93 12/29/2017 0539   BUN 20 12/29/2017 0539   CREATININE 0.82 12/29/2017 0539   CALCIUM 8.3 (L) 12/29/2017 0539   GFRNONAA >60 12/29/2017 0539   GFRAA >60 12/29/2017 0539     Assessment 2 Days Post-Op   Active Problems:   Essential hypertension   History of TIA (transient ischemic attack)   History of COPD   Thrombocytopenia (HCC)   Subcapital fracture of neck of right femur (HCC)  Doing well postop day 2 Eating, drinking, and voiding. Pain controlled.  Plan Up with therapy WBAT Dry dressing PRN SNF Planning in progress.  Follow up with Dr. Mardelle Matte in the office in 2 weeks   Prudencio Burly III 12/29/2017, 1:30 PM

## 2017-12-29 NOTE — Progress Notes (Signed)
Physical Therapy Treatment Patient Details Name: Erica Arnold MRN: [id number] DOB: 04-Nov-1931 Today's Date: 12/29/2017    History of Present Illness Pt is an 84 y/o female who presents s/p mechanical fall at home. Pt sustained a right subcapital femoral neck fracture and is now s/p R hip hemiarthroplasty, posterior approach, and is WBAT.     PT Comments    Patient is making gradual progress toward PT goals. Pt was able to ambulate 53ft with RW and min guard for safety. Pt led through LE therex and tolerated well. Pt needs cues throughout session to maintain hip precautions and min/mod A for functional transfers. Continue to progress as tolerated with anticipated d/c to SNF for further skilled PT services.     Follow Up Recommendations  SNF;Supervision/Assistance - 24 hour     Equipment Recommendations  None recommended by PT(TBD by next venue of care)    Recommendations for Other Services OT consult     Precautions / Restrictions Precautions Precautions: Fall;Posterior Hip Precaution Comments: pt unable to recall precautions; 3/3 precautions reviewed with pt and son Restrictions Weight Bearing Restrictions: Yes RLE Weight Bearing: Weight bearing as tolerated    Mobility  Bed Mobility Overal bed mobility: Needs Assistance Bed Mobility: Supine to Sit     Supine to sit: Min guard     General bed mobility comments: HOB elevated; increased time and effort  Transfers Overall transfer level: Needs assistance Equipment used: Rolling walker (2 wheeled) Transfers: Sit to/from Stand Sit to Stand: Min assist;Mod assist         General transfer comment: grossly min A for safe STS however mod A required when sitting down on BSC with cues for R LE positioning, safe hand placement and maintaining 90 degree or < hip flexion  Ambulation/Gait Ambulation/Gait assistance: Min guard Ambulation Distance (Feet): 55 Feet Assistive device: Rolling walker (2 wheeled) Gait  Pattern/deviations: Trunk flexed;Shuffle;Decreased stance time - right;Decreased step length - left;Step-to pattern Gait velocity: Decreased   General Gait Details: cues for posture, sequencing, and use of bilat UE to assist with clearing L foot from floor   Stairs             Wheelchair Mobility    Modified Rankin (Stroke Patients Only)       Balance Overall balance assessment: Needs assistance Sitting-balance support: Feet supported;No upper extremity supported Sitting balance-Leahy Scale: Good     Standing balance support: During functional activity;Bilateral upper extremity supported Standing balance-Leahy Scale: Poor Standing balance comment: Reliant on UE support                            Cognition Arousal/Alertness: Awake/alert Behavior During Therapy: Flat affect Overall Cognitive Status: Impaired/Different from baseline Area of Impairment: Attention;Memory;Safety/judgement;Awareness;Problem solving                   Current Attention Level: Sustained Memory: Decreased short-term memory;Decreased recall of precautions   Safety/Judgement: Decreased awareness of safety;Decreased awareness of deficits Awareness: Emergent Problem Solving: Decreased initiation;Requires verbal cues General Comments: pt required cues to maintain hip precautions throughout session       Exercises Total Joint Exercises Ankle Circles/Pumps: AROM;Both;15 reps Quad Sets: AROM;Both;10 reps Short Arc Quad: AROM;Right;10 reps Heel Slides: AAROM;Right;10 reps Hip ABduction/ADduction: AAROM;Right;10 reps Long Arc Quad: AROM;Right;10 reps;Seated    General Comments        Pertinent Vitals/Pain Pain Assessment: Faces Faces Pain Scale: Hurts little more Pain Location: R hip Pain  Descriptors / Indicators: Aching;Guarding;Sore Pain Intervention(s): Limited activity within patient's tolerance;Monitored during session;Repositioned    Home Living                       Prior Function            PT Goals (current goals can now be found in the care plan section) Acute Rehab PT Goals PT Goal Formulation: With patient/family Time For Goal Achievement: 01/11/18 Potential to Achieve Goals: Good Progress towards PT goals: Progressing toward goals    Frequency    Min 3X/week      PT Plan Current plan remains appropriate    Co-evaluation              AM-PAC PT "6 Clicks" Daily Activity  Outcome Measure  Difficulty turning over in bed (including adjusting bedclothes, sheets and blankets)?: Unable Difficulty moving from lying on back to sitting on the side of the bed? : Unable Difficulty sitting down on and standing up from a chair with arms (e.g., wheelchair, bedside commode, etc,.)?: Unable Help needed moving to and from a bed to chair (including a wheelchair)?: A Little Help needed walking in hospital room?: A Little Help needed climbing 3-5 steps with a railing? : A Lot 6 Click Score: 11    End of Session Equipment Utilized During Treatment: Gait belt Activity Tolerance: Patient tolerated treatment well Patient left: in chair;with call bell/phone within reach;with family/visitor present Nurse Communication: Mobility status PT Visit Diagnosis: Unsteadiness on feet (R26.81);Pain;Difficulty in walking, not elsewhere classified (R26.2) Pain - Right/Left: Right Pain - part of body: Hip     Time: 1326-1406 PT Time Calculation (min) (ACUTE ONLY): 40 min  Charges:  $Gait Training: 8-22 mins $Therapeutic Exercise: 8-22 mins $Therapeutic Activity: 8-22 mins                    G Codes:       Earney Navy, PTA Pager: [phone]     Darliss Cheney 12/29/2017, 2:22 PM

## 2017-12-29 NOTE — Evaluation (Signed)
Occupational Therapy Evaluation Patient Details Name: Erica Arnold MRN: [id number] DOB: 1932-01-19 Today's Date: 12/29/2017    History of Present Illness Pt is an 84 y/o female who presents s/p mechanical fall at home. Pt sustained a right subcapital femoral neck fracture and is now s/p R hip hemiarthroplasty, posterior approach, and is WBAT.    Clinical Impression   PTA Pt independent in ADL/IADL and mobility. Pt is currently max to total A for LB ADL, min A for sit to stand transfers, and requires cues throughout session to maintain posterior hip precautions. Pt also provided with BUE HEP with level 1 theraband (at children's request) to work on strengthening to assist with transfers and balance with use for RW. Pt able to demonstrate teach back on HEP. Pt will require skilled OT in the acute setting and afterwards at SNF level to maximize safety and independence in ADL and for continued posterior hip education. Next session introduce AE for LB ADL.     Follow Up Recommendations  SNF;Supervision/Assistance - 24 hour    Equipment Recommendations  Other (comment)(defer to next venue)    Recommendations for Other Services       Precautions / Restrictions Precautions Precautions: Fall;Posterior Hip Precaution Booklet Issued: Yes (comment)(hanging on door) Precaution Comments: pt unable to recall precautions; able to recall 2/3 with visual aide; reviewed with son and daughter Restrictions Weight Bearing Restrictions: Yes RLE Weight Bearing: Weight bearing as tolerated      Mobility Bed Mobility Overal bed mobility: Needs Assistance Bed Mobility: Sit to Supine     Supine to sit: Min guard Sit to supine: Mod assist   General bed mobility comments: mod A for BLE back into bed  Transfers Overall transfer level: Needs assistance Equipment used: Rolling walker (2 wheeled) Transfers: Sit to/from Stand Sit to Stand: Min assist         General transfer comment: grossly min A  for safe STS from recliner and BSC over toilet. in recliner vc for hip flexion to maintain precautions    Balance Overall balance assessment: Needs assistance Sitting-balance support: Feet supported;No upper extremity supported Sitting balance-Leahy Scale: Good     Standing balance support: During functional activity;Bilateral upper extremity supported Standing balance-Leahy Scale: Poor Standing balance comment: Reliant on UE support                           ADL either performed or assessed with clinical judgement   ADL Overall ADL's : Needs assistance/impaired Eating/Feeding: Independent;Sitting   Grooming: Set up;Sitting   Upper Body Bathing: Moderate assistance;Sitting Upper Body Bathing Details (indicate cue type and reason): cueing for bending precaution Lower Body Bathing: Maximal assistance   Upper Body Dressing : Set up   Lower Body Dressing: Maximal assistance Lower Body Dressing Details (indicate cue type and reason): total A to don socks, max A for mesh underwear Toilet Transfer: Min guard;Minimal assistance;Cueing for safety;Ambulation;RW;Comfort height toilet;Grab bars Toilet Transfer Details (indicate cue type and reason): cues for precautions with turns Toileting- Water quality scientist and Hygiene: Min guard;Sit to/from stand Toileting - Clothing Manipulation Details (indicate cue type and reason): with warm wash cloth     Functional mobility during ADLs: Min guard;Minimal assistance;Cueing for safety;Rolling walker General ADL Comments: Pt unable to recall hip precautions, cues throughout session. Educated on compensatory strategies, plan to intoroduce AE next session     Vision Patient Visual Report: No change from baseline       Perception  Praxis      Pertinent Vitals/Pain Pain Assessment: Faces Faces Pain Scale: Hurts little more Pain Location: R hip Pain Descriptors / Indicators: Aching;Guarding;Sore Pain Intervention(s): Limited  activity within patient's tolerance;Repositioned     Hand Dominance Right   Extremity/Trunk Assessment Upper Extremity Assessment Upper Extremity Assessment: Overall WFL for tasks assessed;Generalized weakness   Lower Extremity Assessment Lower Extremity Assessment: RLE deficits/detail RLE Deficits / Details: Decreased strength and AROM consistent with above mentioned procedure.    Cervical / Trunk Assessment Cervical / Trunk Assessment: Kyphotic   Communication Communication Communication: HOH   Cognition Arousal/Alertness: Awake/alert Behavior During Therapy: WFL for tasks assessed/performed Overall Cognitive Status: Impaired/Different from baseline Area of Impairment: Memory;Safety/judgement;Awareness;Problem solving                   Current Attention Level: Sustained Memory: Decreased recall of precautions   Safety/Judgement: Decreased awareness of safety;Decreased awareness of deficits Awareness: Emergent Problem Solving: Decreased initiation;Requires verbal cues General Comments: pt required cues to maintain hip precautions throughout session    General Comments       Exercises Exercises: General Upper Extremity Total Joint Exercises Ankle Circles/Pumps: AROM;Both;15 reps Quad Sets: AROM;Both;10 reps Short Arc Quad: AROM;Right;10 reps Heel Slides: AAROM;Right;10 reps Hip ABduction/ADduction: AAROM;Right;10 reps Long Arc Quad: AROM;Right;10 reps;Seated General Exercises - Upper Extremity Shoulder Flexion: AROM;Both;5 reps;Seated;Theraband;Strengthening Theraband Level (Shoulder Flexion): Level 1 (Yellow) Shoulder Extension: AROM;Both;5 reps;Seated;Theraband;Strengthening Theraband Level (Shoulder Extension): Level 1 (Yellow) Shoulder Horizontal ADduction: AROM;Both;5 reps;Seated;Theraband;Strengthening Theraband Level (Shoulder Horizontal Adduction): Level 1 (Yellow) Elbow Flexion: AROM;Strengthening;Both;5 reps;Seated;Theraband Theraband Level (Elbow  Flexion): Level 1 (Yellow)   Shoulder Instructions      Home Living Family/patient expects to be discharged to:: Private residence Living Arrangements: Alone Available Help at Discharge: Family;Available 24 hours/day Type of Home: House Home Access: Stairs to enter [employer] of Steps: 2   Home Layout: One level     Bathroom Shower/Tub: Occupational psychologist: Standard     Home Equipment: Environmental consultant - 2 wheels;Cane - single point;Shower seat - built in          Prior Functioning/Environment Level of Independence: Independent        Comments: Does not drive, family gets groceries for pt, she is able to do all bathing/dressing/house chores        OT Problem List: Decreased strength;Decreased range of motion;Decreased activity tolerance;Impaired balance (sitting and/or standing);Decreased cognition;Decreased safety awareness;Decreased knowledge of use of DME or AE;Decreased knowledge of precautions;Pain      OT Treatment/Interventions: Self-care/ADL training;DME and/or AE instruction;Therapeutic activities;Patient/family education;Balance training    OT Goals(Current goals can be found in the care plan section) Acute Rehab OT Goals Patient Stated Goal: to be independent again OT Goal Formulation: With patient/family Time For Goal Achievement: 01/12/18 Potential to Achieve Goals: Good ADL Goals Pt Will Perform Grooming: with modified independence;standing Pt Will Perform Lower Body Bathing: with modified independence;with adaptive equipment;sit to/from stand Pt Will Perform Lower Body Dressing: with modified independence;sit to/from stand;with adaptive equipment Pt Will Transfer to Toilet: with modified independence;ambulating Pt Will Perform Toileting - Clothing Manipulation and hygiene: with modified independence;sit to/from stand Additional ADL Goal #1: Pt will recall 3/3 posterior hip precautions and maintain throughout session at mod I level  OT  Frequency: Min 2X/week   Barriers to D/C: Decreased caregiver support  Pt lives alone       Co-evaluation              AM-PAC PT "6 Clicks" Daily  Activity     Outcome Measure Help from another person eating meals?: None Help from another person taking care of personal grooming?: None(in sitting) Help from another person toileting, which includes using toliet, bedpan, or urinal?: A Little Help from another person bathing (including washing, rinsing, drying)?: A Lot Help from another person to put on and taking off regular upper body clothing?: None Help from another person to put on and taking off regular lower body clothing?: A Lot 6 Click Score: 19   End of Session Equipment Utilized During Treatment: Gait belt;Rolling walker Nurse Communication: Mobility status  Activity Tolerance: Patient tolerated treatment well Patient left: in bed;with call bell/phone within reach;with family/visitor present  OT Visit Diagnosis: Unsteadiness on feet (R26.81);Other abnormalities of gait and mobility (R26.89);History of falling (Z91.81);Muscle weakness (generalized) (M62.81);Other symptoms and signs involving cognitive function;Pain Pain - Right/Left: Right Pain - part of body: Hip                Time: 2257-5051 OT Time Calculation (min): 39 min Charges:  OT General Charges $OT Visit: 1 Visit OT Evaluation $OT Eval Moderate Complexity: 1 Mod OT Treatments $Self Care/Home Management : 8-22 mins $Therapeutic Exercise: 8-22 mins G-Codes:     Hulda Humphrey OTR/L [place] 12/29/2017, 5:33 PM

## 2017-12-30 LAB — CBC
HEMATOCRIT: 30.6 % — AB (ref 36.0–46.0)
Hemoglobin: 10.1 g/dL — ABNORMAL LOW (ref 12.0–15.0)
MCH: 30.1 pg (ref 26.0–34.0)
MCHC: 33 g/dL (ref 30.0–36.0)
MCV: 91.3 fL (ref 78.0–100.0)
Platelets: 92 10*3/uL — ABNORMAL LOW (ref 150–400)
RBC: 3.35 MIL/uL — AB (ref 3.87–5.11)
RDW: 13.1 % (ref 11.5–15.5)
WBC: 7.6 10*3/uL (ref 4.0–10.5)

## 2017-12-30 LAB — BASIC METABOLIC PANEL
ANION GAP: 6 (ref 5–15)
BUN: 15 mg/dL (ref 6–20)
CHLORIDE: 103 mmol/L (ref 101–111)
CO2: 30 mmol/L (ref 22–32)
Calcium: 8.2 mg/dL — ABNORMAL LOW (ref 8.9–10.3)
Creatinine, Ser: 0.54 mg/dL (ref 0.44–1.00)
GFR calc Af Amer: >60 mL/min (ref >60)
GLUCOSE: 95 mg/dL (ref 65–99)
POTASSIUM: 3.8 mmol/L (ref 3.5–5.1)
Sodium: 139 mmol/L (ref 135–145)

## 2017-12-30 NOTE — Progress Notes (Signed)
PROGRESS NOTE  Erica Arnold:[id number] DOB: March 07, 1932 DOA: 12/26/2017 PCP: Patient, No Pcp Per   LOS: 4 days   Brief Narrative / Interim history: 84 year old female with history of hypertension, COPD, prior TIA who presents to the hospital with right hip fracture after having a mechanical fall.  There is no loss of consciousness, chest pain, palpitations, difficulties breathing.  She remembers the fall.  Denies any fever or chills.  Assessment & Plan: Active Problems:   Essential hypertension   History of TIA (transient ischemic attack)   History of COPD   Thrombocytopenia (HCC)   Subcapital fracture of neck of right femur (HCC)   Right subcapital femoral neck fracture -Orthopedic surgery consulted, status post right hip hemiarthroplasty on 5/16 by Dr. Mardelle Matte -Pain controlled, she took Tylenol and that seems to help. -Worked with physical therapy last night, able to ambulate, recommending SNF to which patient and daughter are agreeable -Consulted social worker  Hypertension -Patient intermittently taking metoprolol at home, she does not check her blood pressure regularly but when she gets hand numbness she started taking the metoprolol.  She tells me that she takes her metoprolol for "TIA" -Continue metoprolol  Hyperkalemia -Due to IV fluids, discontinued on 5/17, potassium within normal limits this morning  Acute blood loss anemia -Hemoglobin dropped to 10 from 15 on admission, no need for transfusion will continue to monitor  History of TIA -Unable to elaborate too much of the symptoms but as above gets intermittent hand numbness -No symptoms currently  Thrombocytopenia -No blood work in the last 9 years that I have access to, I am not sure if this is acute or chronic, will continue to monitor -Platelets are stable   DVT prophylaxis: SCDs Code Status: DNR Family Communication: Daughter present at bedside Disposition Plan: SNF when bed available   Consultants:    Orthopedic surgery  Procedures:   None   Antimicrobials:  None    Subjective: - no chest pain, shortness of breath, no abdominal pain, nausea or vomiting.   Objective: Vitals:   12/29/17 0623 12/29/17 1431 12/29/17 1957 12/30/17 0446  BP: (!) 155/76 129/60 137/63 138/88  Pulse: 96 90 87 93  Resp: 18 16  16   Temp: 98.3 F (36.8 C) 98.1 F (36.7 C) 99.3 F (37.4 C) 98.3 F (36.8 C)  TempSrc: Oral Oral Oral Oral  SpO2: 93% 95% 91% 95%  Weight:      Height:        Intake/Output Summary (Last 24 hours) at 12/30/2017 1300 Last data filed at 12/30/2017 1000 Gross per 24 hour  Intake 480 ml  Output -  Net 480 ml   Filed Weights   12/26/17 0945 12/27/17 1245  Weight: 44.5 kg (98 lb) 44.5 kg (98 lb)    Examination:  Constitutional: NAD Respiratory: CTA Cardiovascular: RRR  Data Reviewed: I have independently reviewed following labs and imaging studies   CBC: Recent Labs  Lab 12/26/17 1051 12/27/17 0405 12/28/17 0408 12/29/17 0539 12/30/17 0618  WBC 10.9* 9.6 10.4 9.0 7.6  NEUTROABS 10.0*  --   --   --   --   HGB 15.5* 15.3* 13.3 10.8* 10.1*  HCT 46.8* 45.2 40.0 32.5* 30.6*  MCV 90.3 90.8 91.3 91.0 91.3  PLT 124* 113* 120* 98* 92*   Basic Metabolic Panel: Recent Labs  Lab 12/26/17 1051 12/27/17 0405 12/28/17 0408 12/29/17 0539 12/30/17 0618  NA 141 139 139 141 139  K 3.7 3.5 5.4* 4.0 3.8  CL 103  103 107 107 103  CO2 27 28 26 28 30   GLUCOSE 120* 98 145* 93 95  BUN 16 10 14 20 15   CREATININE 0.75 0.60 0.91 0.82 0.54  CALCIUM 9.1 8.8* 7.8* 8.3* 8.2*   GFR: Estimated Creatinine Clearance: 37.4 mL/min (by C-G formula based on SCr of 0.54 mg/dL). Liver Function Tests: No results for input(s): AST, ALT, ALKPHOS, BILITOT, PROT, ALBUMIN in the last 168 hours. No results for input(s): LIPASE, AMYLASE in the last 168 hours. No results for input(s): AMMONIA in the last 168 hours. Coagulation Profile: Recent Labs  Lab 12/26/17 1051  INR 0.96    Cardiac Enzymes: No results for input(s): CKTOTAL, CKMB, CKMBINDEX, TROPONINI in the last 168 hours. BNP (last 3 results) No results for input(s): PROBNP in the last 8760 hours. HbA1C: No results for input(s): HGBA1C in the last 72 hours. CBG: Recent Labs  Lab 12/27/17 2135  GLUCAP 139*   Lipid Profile: No results for input(s): CHOL, HDL, LDLCALC, TRIG, CHOLHDL, LDLDIRECT in the last 72 hours. Thyroid Function Tests: No results for input(s): TSH, T4TOTAL, FREET4, T3FREE, THYROIDAB in the last 72 hours. Anemia Panel: Recent Labs    12/28/17 0408  VITAMINB12 596  FOLATE 17.8  FERRITIN 137  TIBC 216*  IRON 30  RETICCTPCT 0.9   Urine analysis:    Component Value Date/Time   COLORURINE YELLOW 12/27/2017 0448   APPEARANCEUR CLEAR 12/27/2017 0448   LABSPEC 1.011 12/27/2017 0448   PHURINE 7.0 12/27/2017 0448   GLUCOSEU NEGATIVE 12/27/2017 0448   HGBUR SMALL (A) 12/27/2017 0448   BILIRUBINUR NEGATIVE 12/27/2017 0448   KETONESUR 5 (A) 12/27/2017 0448   PROTEINUR NEGATIVE 12/27/2017 0448   NITRITE NEGATIVE 12/27/2017 0448   LEUKOCYTESUR NEGATIVE 12/27/2017 0448   Sepsis Labs: Invalid input(s): PROCALCITONIN, LACTICIDVEN  Recent Results (from the past 240 hour(s))  Surgical pcr screen     Status: None   Collection Time: 12/27/17  4:45 AM  Result Value Ref Range Status   MRSA, PCR NEGATIVE NEGATIVE Final   Staphylococcus aureus NEGATIVE NEGATIVE Final    Comment: (NOTE) The Xpert SA Assay (FDA approved for NASAL specimens in patients 37 years of age and older), is one component of a comprehensive surveillance program. It is not intended to diagnose infection nor to guide or monitor treatment. Performed at [facility], [place] [address], [place], [place] [address]       Radiology Studies: No results found.   Scheduled Meds: . docusate sodium  100 mg Oral BID  . enoxaparin (LOVENOX) injection  30 mg Subcutaneous Q24H  . ferrous sulfate  325 mg Oral TID PC   . metoprolol tartrate  12.5 mg Oral BID   Continuous Infusions:   Marzetta Board, MD, PhD [facility] Pager [phone]  If 7PM-7AM, please contact night-coverage [email or web address] Password [email or web address] 12/30/2017, 1:00 PM

## 2017-12-30 NOTE — Clinical Social Work Note (Addendum)
Per MD pt's daughter has a facility in mind for pt. CSW spoke with pt's daughter via telephone. Pt's daughter had been working with [facility] with ortho. Pt's daughter provided [facility] with a list of preferred facilities. Per pt's daughter, [facility] was going to speak with CSW to determine bed offers. Pt's daughter prefers 1) [facility], 2) [place], 3) [employer] [facility]. CSW reached out to [facility] to review pt on the hub. CSW to follow up with pt's daughter as well as Renee on 5/20.   [place], [place]

## 2017-12-30 NOTE — Progress Notes (Signed)
Patient ID: Erica Arnold, female   DOB: [birth year], 84 y.o.   MRN: [id number]     Subjective:  No interval events.  Still doing well and ready for SNF.  Patient reports pain as mild.      Mobilizing well w/ PT.  Objective:   VITALS:   Vitals:   12/29/17 0623 12/29/17 1431 12/29/17 1957 12/30/17 0446  BP: (!) 155/76 129/60 137/63 138/88  Pulse: 96 90 87 93  Resp: 18 16  16   Temp: 98.3 F (36.8 C) 98.1 F (36.7 C) 99.3 F (37.4 C) 98.3 F (36.8 C)  TempSrc: Oral Oral Oral Oral  SpO2: 93% 95% 91% 95%  Weight:      Height:       General: NAD.  Upright in bed.  Calm, conversant.  Family at bedside. MSK Sensation intact distally Knee flexion / extension intact Dorsiflexion/Plantar flexion intact Incision: dressing C/D/I.  No drainage   Lab Results  Component Value Date   WBC 7.6 12/30/2017   HGB 10.1 (L) 12/30/2017   HCT 30.6 (L) 12/30/2017   MCV 91.3 12/30/2017   PLT 92 (L) 12/30/2017   BMET    Component Value Date/Time   NA 139 12/30/2017 0618   K 3.8 12/30/2017 0618   CL 103 12/30/2017 0618   CO2 30 12/30/2017 0618   GLUCOSE 95 12/30/2017 0618   BUN 15 12/30/2017 0618   CREATININE 0.54 12/30/2017 0618   CALCIUM 8.2 (L) 12/30/2017 0618   GFRNONAA >60 12/30/2017 0618   GFRAA >60 12/30/2017 0618     Assessment 3 Days Post-Op   Active Problems:   Essential hypertension   History of TIA (transient ischemic attack)   History of COPD   Thrombocytopenia (HCC)   Subcapital fracture of neck of right femur ([place])  Doing well  Eating, drinking, and voiding. Pain controlled.  Plan Up with therapy WBAT Dry dressing PRN - Mepilex SNF Planning in progress.  Follow up with Dr. Mardelle Matte in the office in 2 weeks.  Please call with questions.   Erica Arnold III 12/30/2017, 10:48 AM

## 2017-12-31 DIAGNOSIS — Z8673 Personal history of transient ischemic attack (TIA), and cerebral infarction without residual deficits: Secondary | ICD-10-CM | POA: Diagnosis not present

## 2017-12-31 DIAGNOSIS — S7290XA Unspecified fracture of unspecified femur, initial encounter for closed fracture: Secondary | ICD-10-CM | POA: Diagnosis not present

## 2017-12-31 DIAGNOSIS — M62838 Other muscle spasm: Secondary | ICD-10-CM | POA: Diagnosis not present

## 2017-12-31 DIAGNOSIS — R2689 Other abnormalities of gait and mobility: Secondary | ICD-10-CM | POA: Diagnosis not present

## 2017-12-31 DIAGNOSIS — M40209 Unspecified kyphosis, site unspecified: Secondary | ICD-10-CM | POA: Diagnosis not present

## 2017-12-31 DIAGNOSIS — J449 Chronic obstructive pulmonary disease, unspecified: Secondary | ICD-10-CM | POA: Diagnosis not present

## 2017-12-31 DIAGNOSIS — G459 Transient cerebral ischemic attack, unspecified: Secondary | ICD-10-CM | POA: Diagnosis not present

## 2017-12-31 DIAGNOSIS — Z7401 Bed confinement status: Secondary | ICD-10-CM | POA: Diagnosis not present

## 2017-12-31 DIAGNOSIS — Z9181 History of falling: Secondary | ICD-10-CM | POA: Diagnosis not present

## 2017-12-31 DIAGNOSIS — G8911 Acute pain due to trauma: Secondary | ICD-10-CM | POA: Diagnosis not present

## 2017-12-31 DIAGNOSIS — S72011A Unspecified intracapsular fracture of right femur, initial encounter for closed fracture: Secondary | ICD-10-CM | POA: Diagnosis not present

## 2017-12-31 DIAGNOSIS — M2550 Pain in unspecified joint: Secondary | ICD-10-CM | POA: Diagnosis not present

## 2017-12-31 DIAGNOSIS — D696 Thrombocytopenia, unspecified: Secondary | ICD-10-CM | POA: Diagnosis not present

## 2017-12-31 DIAGNOSIS — M6281 Muscle weakness (generalized): Secondary | ICD-10-CM | POA: Diagnosis not present

## 2017-12-31 DIAGNOSIS — Z471 Aftercare following joint replacement surgery: Secondary | ICD-10-CM | POA: Diagnosis not present

## 2017-12-31 DIAGNOSIS — S72001A Fracture of unspecified part of neck of right femur, initial encounter for closed fracture: Secondary | ICD-10-CM | POA: Diagnosis not present

## 2017-12-31 DIAGNOSIS — M25551 Pain in right hip: Secondary | ICD-10-CM | POA: Diagnosis not present

## 2017-12-31 DIAGNOSIS — Z8709 Personal history of other diseases of the respiratory system: Secondary | ICD-10-CM | POA: Diagnosis not present

## 2017-12-31 DIAGNOSIS — Z4789 Encounter for other orthopedic aftercare: Secondary | ICD-10-CM | POA: Diagnosis not present

## 2017-12-31 DIAGNOSIS — I10 Essential (primary) hypertension: Secondary | ICD-10-CM | POA: Diagnosis not present

## 2017-12-31 MED ORDER — HYDROCODONE-ACETAMINOPHEN 5-325 MG PO TABS: 1.0000 | ORAL_TABLET | ORAL | 0 refills | Status: DC | PRN

## 2017-12-31 NOTE — Care Management Important Message (Signed)
Important Message  Patient Details  Name: Erica Arnold MRN: [id number] Date of Birth: 1931/12/21   Medicare Important Message Given:  Yes    Emberlee Sortino [address] 12/31/2017, 3:52 PM

## 2017-12-31 NOTE — Progress Notes (Signed)
Physical Therapy Treatment Patient Details Name: Erica Arnold MRN: [id number] DOB: Oct 09, 1931 Today's Date: 12/31/2017    History of Present Illness Pt is an 84 y/o female who presents s/p mechanical fall at home. Pt sustained a right subcapital femoral neck fracture and is now s/p R hip hemiarthroplasty, posterior approach, and is WBAT.     PT Comments    Pt progressing towards physical therapy goals. Was able to perform transfers and ambulation with gross min guard assist and occasional min assist for LE movement. Some support given to surgical LE initially during therapeutic exercise, however pt was able to progress to active ROM by end of session. Youth walker utilized during gait training for more comfortable height. Pt was able to progress almost to smooth step-through gait pattern by end of session. Pt and family anticipate d/c to SNF today. Will continue to follow.    Follow Up Recommendations  SNF;Supervision/Assistance - 24 hour     Equipment Recommendations  Other (comment)(TBD by next venue of care. )    Recommendations for Other Services       Precautions / Restrictions Precautions Precautions: Fall;Posterior Hip Precaution Booklet Issued: Yes (comment) Precaution Comments: Pt able to recall 1/3 precautions. 3/3 reviewed with pt and daughter-in-law Restrictions Weight Bearing Restrictions: Yes RLE Weight Bearing: Weight bearing as tolerated    Mobility  Bed Mobility Overal bed mobility: Needs Assistance Bed Mobility: Sit to Supine     Supine to sit: Min assist     General bed mobility comments: Assist for LE movement towards EOB.  Transfers Overall transfer level: Needs assistance Equipment used: Rolling walker (2 wheeled) Transfers: Sit to/from Stand Sit to Stand: Min guard         General transfer comment: Hands on assist for balance support and safety. Pt was cued for hand placement on seated surface for safety, and powered up to full stand  without physical assist.   Ambulation/Gait Ambulation/Gait assistance: Min guard Ambulation Distance (Feet): 110 Feet Assistive device: Rolling walker (2 wheeled) Gait Pattern/deviations: Trunk flexed;Shuffle;Decreased stance time - right;Decreased step length - left;Step-to pattern Gait velocity: Decreased Gait velocity interpretation: <1.8 ft/sec, indicate of risk for recurrent falls General Gait Details: cues for posture, sequencing, and use of bilat UE to assist with clearing L foot from floor   Stairs             Wheelchair Mobility    Modified Rankin (Stroke Patients Only)       Balance Overall balance assessment: Needs assistance Sitting-balance support: Feet supported;No upper extremity supported Sitting balance-Leahy Scale: Good     Standing balance support: During functional activity;Bilateral upper extremity supported Standing balance-Leahy Scale: Poor Standing balance comment: Reliant on UE support                            Cognition Arousal/Alertness: Awake/alert Behavior During Therapy: WFL for tasks assessed/performed Overall Cognitive Status: Impaired/Different from baseline Area of Impairment: Memory;Safety/judgement;Awareness;Problem solving                   Current Attention Level: Selective Memory: Decreased recall of precautions   Safety/Judgement: Decreased awareness of safety Awareness: Emergent Problem Solving: Decreased initiation;Requires verbal cues        Exercises Total Joint Exercises Ankle Circles/Pumps: 10 reps;Both Quad Sets: 10 reps;Both Gluteal Sets: 10 reps;Both Towel Squeeze: 10 reps;Both Hip ABduction/ADduction: 10 reps;Right Long Arc Quad: 10 reps;Right    General Comments  Pertinent Vitals/Pain Pain Assessment: 0-10 Pain Score: 5  Pain Location: R hip Pain Descriptors / Indicators: Aching;Guarding;Sore Pain Intervention(s): Monitored during session    Home Living                       Prior Function            PT Goals (current goals can now be found in the care plan section) Acute Rehab PT Goals Patient Stated Goal: to be independent again PT Goal Formulation: With patient/family Time For Goal Achievement: 01/11/18 Potential to Achieve Goals: Good Progress towards PT goals: Progressing toward goals    Frequency    Min 3X/week      PT Plan Current plan remains appropriate    Co-evaluation              AM-PAC PT "6 Clicks" Daily Activity  Outcome Measure  Difficulty turning over in bed (including adjusting bedclothes, sheets and blankets)?: A Little Difficulty moving from lying on back to sitting on the side of the bed? : A Little Difficulty sitting down on and standing up from a chair with arms (e.g., wheelchair, bedside commode, etc,.)?: A Little Help needed moving to and from a bed to chair (including a wheelchair)?: A Little Help needed walking in hospital room?: A Little Help needed climbing 3-5 steps with a railing? : A Little 6 Click Score: 18    End of Session Equipment Utilized During Treatment: Gait belt Activity Tolerance: Patient tolerated treatment well Patient left: in chair;with call bell/phone within reach;with family/visitor present;with chair alarm set Nurse Communication: Mobility status PT Visit Diagnosis: Unsteadiness on feet (R26.81);Pain;Difficulty in walking, not elsewhere classified (R26.2) Pain - Right/Left: Right Pain - part of body: Hip     Time: 7673-4193 PT Time Calculation (min) (ACUTE ONLY): 27 min  Charges:  $Gait Training: 8-22 mins $Therapeutic Exercise: 8-22 mins                    G Codes:       Rolinda Roan, PT, DPT Acute Rehabilitation Services Pager: [phone]    Thelma Comp 12/31/2017, 8:50 AM

## 2017-12-31 NOTE — Discharge Summary (Signed)
Physician Discharge Summary  Erica Arnold TOI:[id number] DOB: Apr 26, 1932 DOA: 12/26/2017  PCP: Patient, No Pcp Per  Admit date: 12/26/2017 Discharge date: 12/31/2017  Admitted From: home Disposition:  SNF  Recommendations for Outpatient Follow-up:  1. Follow up with PCP in 1-2 weeks 2. Follow-up with Dr. Mardelle Matte as scheduled  Home Health: none Equipment/Devices: none  Discharge Condition: stable CODE STATUS: DNR Diet recommendation: regular  HPI: Per Erica Nixon, PA Erica Arnold is a 84 y.o. female with medical history significant for HTN, history of TIA not on ASA, COPD not on meds, brought to the emergency department today after suffering a mechanical fall at home, landing on the right side.  The patient to get up and walk since then.  She denies any loss of consciousness.  She denies any head injury or vision changes.  She denies any neck pain or trouble swallowing.  She denies any pain or weakness in her arms. When she presented, the right hip pain was 10 out of 10, that after medication administration, was reduced to about 2 out of 10.  She denies any other areas of injury.  She denies any fever or chills or recent infections.  She denies any chest pain, she has occasional palpitations, but she reports that this is due to not using her beta-blocker as instructed.  She denies any shortness of breath, or cough.  She denies any abdominal pain, nausea or vomiting. No diarrhea  She denies any significant leg swelling.  She does have external rotation of her right leg after the fall.  She denies any unilateral weakness prior to the event.  Of note, the patient states that at times, she has tingling similar to her TIA, but none over the last few weeks.  She states that when the symptoms appear, she takes an aspirin.She is a poor historian, and does not offer or elaborate further information.  She is very selective on the responses. Tobacco, alcohol or recreational drug use.  No recent long  distance travels.  Hospital Course: Right subcapital femoral neck fracture -Orthopedic surgery consulted, status post right hip hemiarthroplasty on 5/16 by Dr. Mardelle Matte. Recovered well post op, to continue rehab in SNF setting.  Hypertension -Continue metoprolol Acute blood loss anemia -Hemoglobin dropped to 10 from 15 on admission, no need for transfusion. Stable History of TIA -Unable to elaborate too much of the symptoms but as above gets intermittent hand numbness Thrombocytopenia -likely chronic, but no labs in 9 years. Stable, no bleeding     Discharge Diagnoses:  Active Problems:   Essential hypertension   History of TIA (transient ischemic attack)   History of COPD   Thrombocytopenia (HCC)   Subcapital fracture of neck of right femur [facility])     Discharge Instructions  Discharge Instructions    Posterior total hip precautions   Complete by:  As directed    Weight bearing as tolerated   Complete by:  As directed      Allergies as of 12/31/2017      Reactions   Doxycycline Shortness Of Breath   Tetanus Toxoids Anaphylaxis   shock   Other Other (See Comments)   BP medication given at [facility] caused BP to drop drastically.       Medication List    TAKE these medications   acetaminophen 325 MG tablet Commonly known as:  TYLENOL Take 2 tablets (650 mg total) by mouth every 6 (six) hours as needed.   baclofen 10 MG tablet Commonly known  as:  LIORESAL Take 1 tablet (10 mg total) by mouth 3 (three) times daily. As needed for muscle spasm   enoxaparin 30 MG/0.3ML injection Commonly known as:  LOVENOX Inject 0.3 mLs (30 mg total) into the skin daily.   HYDROcodone-acetaminophen 5-325 MG tablet Commonly known as:  NORCO/VICODIN Take 1-2 tablets by mouth every 4 (four) hours as needed for moderate pain (pain score 4-6).   loratadine 10 MG tablet Commonly known as:  CLARITIN Take 0-10 mg by mouth daily as needed for allergies.   metoprolol tartrate 25 MG  tablet Commonly known as:  LOPRESSOR Take 0-25 mg by mouth as needed (for mini-strokes).            Discharge Care Instructions  (From admission, onward)        Start     Ordered   12/27/17 0000  Weight bearing as tolerated     12/27/17 1724      Contact information for follow-up providers    Marchia Bond, MD. Schedule an appointment as soon as possible for a visit in 2 weeks.   Specialty:  Orthopedic Surgery Contact information: [place] [address] [phone]            Contact information for after-discharge care    Destination    [facility] .   Service:  Skilled Nursing Contact information: [address] [place] [phone]                  Consultations:  Orthopedic surgery   Procedures/Studies:  Dg Chest 1 View  Result Date: 12/26/2017 CLINICAL DATA:  Recent fall with right hip fracture, initial encounter EXAM: CHEST  1 VIEW COMPARISON:  12/17/2008 FINDINGS: Cardiac shadow is within normal limits. Skin folds are noted bilaterally. The lungs are clear without focal infiltrate. No acute bony abnormality is noted. IMPRESSION: No acute abnormality seen. Electronically Signed   By: Inez Catalina M.D.   On: 12/26/2017 10:49   Dg Hip Port Unilat With Pelvis 1v Right  Result Date: 12/27/2017 CLINICAL DATA:  Status post ORIF.  Hip fracture. EXAM: DG HIP (WITH OR WITHOUT PELVIS) 1V PORT RIGHT COMPARISON:  Dec 26, 2017 FINDINGS: Patient is status post right hip replacement. Hardware is in good position. Postoperative air is seen in the soft tissues. Vascular calcifications. IMPRESSION: Postoperative changes in the right hip as above. Electronically Signed   By: Dorise Bullion III M.D   On: 12/27/2017 18:39   Dg Hip Unilat With Pelvis 2-3 Views Right  Result Date: 12/26/2017 CLINICAL DATA:  Recent fall with right hip pain, initial encounter EXAM: DG HIP (WITH OR WITHOUT PELVIS)  3V RIGHT COMPARISON:  None. FINDINGS: Pelvic ring is intact. A the subcapital femoral neck fracture is identified with impaction at the fracture site. Some soft tissue calcification is noted adjacent to the intratrochanteric region. This is of uncertain chronicity. No other focal abnormality is seen IMPRESSION: Right subcapital femoral neck fracture. Soft tissue calcifications are noted adjacent to the greater trochanter. Electronically Signed   By: Inez Catalina M.D.   On: 12/26/2017 10:49      Subjective: - no chest pain, shortness of breath, no abdominal pain, nausea or vomiting.    Discharge Exam: Vitals:   12/30/17 2056 12/31/17 0605  BP: (!) 152/82 (!) 151/63  Pulse: 81 83  Resp: 20 20  Temp: 99.3 F (37.4 C) 98 F (36.7 C)  SpO2: 95% 95%  General: Pt is alert, awake, not in acute distress Cardiovascular: RRR, S1/S2 +, no rubs, no gallops Respiratory: CTA bilaterally, no wheezing, no rhonchi Abdominal: Soft, NT, ND, bowel sounds + Extremities: no edema, no cyanosis    The results of significant diagnostics from this hospitalization (including imaging, microbiology, ancillary and laboratory) are listed below for reference.     Microbiology: Recent Results (from the past 240 hour(s))  Surgical pcr screen     Status: None   Collection Time: 12/27/17  4:45 AM  Result Value Ref Range Status   MRSA, PCR NEGATIVE NEGATIVE Final   Staphylococcus aureus NEGATIVE NEGATIVE Final    Comment: (NOTE) The Xpert SA Assay (FDA approved for NASAL specimens in patients 63 years of age and older), is one component of a comprehensive surveillance program. It is not intended to diagnose infection nor to guide or monitor treatment. Performed at [facility], [place] [address] [place], [place] [address]      Labs: BNP (last 3 results) No results for input(s): BNP in the last 8760 hours. Basic Metabolic Panel: Recent Labs  Lab 12/26/17 1051 12/27/17 0405 12/28/17 0408  12/29/17 0539 12/30/17 0618  NA 141 139 139 141 139  K 3.7 3.5 5.4* 4.0 3.8  CL 103 103 107 107 103  CO2 27 28 26 28 30   GLUCOSE 120* 98 145* 93 95  BUN 16 10 14 20 15   CREATININE 0.75 0.60 0.91 0.82 0.54  CALCIUM 9.1 8.8* 7.8* 8.3* 8.2*   Liver Function Tests: No results for input(s): AST, ALT, ALKPHOS, BILITOT, PROT, ALBUMIN in the last 168 hours. No results for input(s): LIPASE, AMYLASE in the last 168 hours. No results for input(s): AMMONIA in the last 168 hours. CBC: Recent Labs  Lab 12/26/17 1051 12/27/17 0405 12/28/17 0408 12/29/17 0539 12/30/17 0618  WBC 10.9* 9.6 10.4 9.0 7.6  NEUTROABS 10.0*  --   --   --   --   HGB 15.5* 15.3* 13.3 10.8* 10.1*  HCT 46.8* 45.2 40.0 32.5* 30.6*  MCV 90.3 90.8 91.3 91.0 91.3  PLT 124* 113* 120* 98* 92*   Cardiac Enzymes: No results for input(s): CKTOTAL, CKMB, CKMBINDEX, TROPONINI in the last 168 hours. BNP: Invalid input(s): POCBNP CBG: Recent Labs  Lab 12/27/17 2135  GLUCAP 139*   D-Dimer No results for input(s): DDIMER in the last 72 hours. Hgb A1c No results for input(s): HGBA1C in the last 72 hours. Lipid Profile No results for input(s): CHOL, HDL, LDLCALC, TRIG, CHOLHDL, LDLDIRECT in the last 72 hours. Thyroid function studies No results for input(s): TSH, T4TOTAL, T3FREE, THYROIDAB in the last 72 hours.  Invalid input(s): FREET3 Anemia work up No results for input(s): VITAMINB12, FOLATE, FERRITIN, TIBC, IRON, RETICCTPCT in the last 72 hours. Urinalysis    Component Value Date/Time   COLORURINE YELLOW 12/27/2017 0448   APPEARANCEUR CLEAR 12/27/2017 0448   LABSPEC 1.011 12/27/2017 0448   PHURINE 7.0 12/27/2017 0448   GLUCOSEU NEGATIVE 12/27/2017 0448   HGBUR SMALL (A) 12/27/2017 0448   BILIRUBINUR NEGATIVE 12/27/2017 0448   KETONESUR 5 (A) 12/27/2017 0448   PROTEINUR NEGATIVE 12/27/2017 0448   NITRITE NEGATIVE 12/27/2017 0448   LEUKOCYTESUR NEGATIVE 12/27/2017 0448   Sepsis Labs Invalid input(s):  PROCALCITONIN,  WBC,  LACTICIDVEN   Time coordinating discharge: 25 minutes  SIGNED:  Marzetta Board, MD  [facility] 12/31/2017, 10:02 AM Pager [phone]  If 7PM-7AM, please contact night-coverage [email or web address] Password [email or web address]

## 2017-12-31 NOTE — Progress Notes (Signed)
Report given to [facility].  Pt alert and oriented x4.  Pt denies chest pain, shortness of breath, dizziness, lightheadedness, and n/v.  Pt resting in bed awaiting transportation.  Pt's pickup time is 4 pm.

## 2017-12-31 NOTE — Care Management Important Message (Signed)
Important Message  Patient Details  Name: Erica Arnold MRN: [id number] Date of Birth: June 13, 1932   Medicare Important Message Given:  Yes    Erenest Rasher, RN 12/31/2017, 11:01 AM

## 2017-12-31 NOTE — Clinical Social Work Placement (Signed)
   CLINICAL SOCIAL WORK PLACEMENT  NOTE  Date:  12/31/2017  Patient Details  Name: Erica Arnold MRN: [id number] Date of Birth: 12/08/1931  Clinical Social Work is seeking post-discharge placement for this patient at the [place] level of care (*CSW will initial, date and re-position this form in  chart as items are completed):  Yes   Patient/family provided with [facility] Work Department's list of facilities offering this level of care within the geographic area requested by the patient (or if unable, by the patient's family).  Yes   Patient/family informed of their freedom to choose among providers that offer the needed level of care, that participate in Medicare, Medicaid or managed care program needed by the patient, have an available bed and are willing to accept the patient.  Yes   Patient/family informed of [facility] ownership interest in [facility] and [facility], as well as of the fact that they are under no obligation to receive care at these facilities.  PASRR submitted to EDS on       PASRR number received on 12/28/17     Existing PASRR number confirmed on       FL2 transmitted to all facilities in geographic area requested by pt/family on 12/28/17     FL2 transmitted to all facilities within larger geographic area on       Patient informed that his/her managed care company has contracts with or will negotiate with certain facilities, including the following:        Yes   Patient/family informed of bed offers received.  Patient chooses bed at [facility]     Physician recommends and patient chooses bed at      Patient to be transferred to [facility] on 12/31/17.  Patient to be transferred to facility by [facility]     Patient family notified on 12/31/17 of transfer.  Name of family member notified:  daughter [name] contacted     PHYSICIAN       Additional Comment:     _______________________________________________ Normajean Baxter, LCSW 12/31/2017, 10:02 AM

## 2017-12-31 NOTE — Social Work (Signed)
Clinical Social Worker facilitated patient discharge including contacting patient family and facility to confirm patient discharge plans.  Clinical information faxed to facility and family agreeable with plan.    CSW arranged ambulance transport via [place] [facility] to [facility] at 2:30pm.    RN to call [phone] to give report prior to discharge.  Clinical Social Worker will sign off for now as social work intervention is no longer needed. Please consult [place] again if new need arises.  Elissa Hefty, LCSW Clinical Social Worker [phone]

## 2017-12-31 NOTE — Social Work (Addendum)
CSW contacted [facility] to confirm bed offer. CSW awaiting a call back.  SNF confirmed bed offer.   CSW then contacted daughter who was interested in [place]. CSW then called admission staff at [facility] who confirmed that they do not have a bed. CSW then called daughter back and she accepted SNF bed at [facility].   CSW f/u for discharge.  Elissa Hefty, LCSW Clinical Social Worker [phone]

## 2018-01-01 DIAGNOSIS — I10 Essential (primary) hypertension: Secondary | ICD-10-CM | POA: Diagnosis not present

## 2018-01-01 DIAGNOSIS — M40209 Unspecified kyphosis, site unspecified: Secondary | ICD-10-CM | POA: Diagnosis not present

## 2018-01-01 DIAGNOSIS — S7290XA Unspecified fracture of unspecified femur, initial encounter for closed fracture: Secondary | ICD-10-CM | POA: Diagnosis not present

## 2018-01-01 DIAGNOSIS — J449 Chronic obstructive pulmonary disease, unspecified: Secondary | ICD-10-CM | POA: Diagnosis not present

## 2018-01-02 DIAGNOSIS — Z9181 History of falling: Secondary | ICD-10-CM | POA: Diagnosis not present

## 2018-01-02 DIAGNOSIS — M25551 Pain in right hip: Secondary | ICD-10-CM | POA: Diagnosis not present

## 2018-01-02 DIAGNOSIS — R2689 Other abnormalities of gait and mobility: Secondary | ICD-10-CM | POA: Diagnosis not present

## 2018-01-04 DIAGNOSIS — Z9181 History of falling: Secondary | ICD-10-CM | POA: Diagnosis not present

## 2018-01-04 DIAGNOSIS — M25551 Pain in right hip: Secondary | ICD-10-CM | POA: Diagnosis not present

## 2018-01-04 DIAGNOSIS — R2689 Other abnormalities of gait and mobility: Secondary | ICD-10-CM | POA: Diagnosis not present

## 2018-01-07 DIAGNOSIS — M6281 Muscle weakness (generalized): Secondary | ICD-10-CM | POA: Diagnosis not present

## 2018-01-07 DIAGNOSIS — Z4789 Encounter for other orthopedic aftercare: Secondary | ICD-10-CM | POA: Diagnosis not present

## 2018-01-07 DIAGNOSIS — R2689 Other abnormalities of gait and mobility: Secondary | ICD-10-CM | POA: Diagnosis not present

## 2018-01-09 DIAGNOSIS — Z4789 Encounter for other orthopedic aftercare: Secondary | ICD-10-CM | POA: Diagnosis not present

## 2018-01-09 DIAGNOSIS — R2689 Other abnormalities of gait and mobility: Secondary | ICD-10-CM | POA: Diagnosis not present

## 2018-01-09 DIAGNOSIS — R2241 Localized swelling, mass and lump, right lower limb: Secondary | ICD-10-CM | POA: Diagnosis not present

## 2018-01-09 DIAGNOSIS — M6281 Muscle weakness (generalized): Secondary | ICD-10-CM | POA: Diagnosis not present

## 2018-01-11 DIAGNOSIS — R2689 Other abnormalities of gait and mobility: Secondary | ICD-10-CM | POA: Diagnosis not present

## 2018-01-11 DIAGNOSIS — Z4789 Encounter for other orthopedic aftercare: Secondary | ICD-10-CM | POA: Diagnosis not present

## 2018-01-11 DIAGNOSIS — M6281 Muscle weakness (generalized): Secondary | ICD-10-CM | POA: Diagnosis not present

## 2018-01-15 DIAGNOSIS — I10 Essential (primary) hypertension: Secondary | ICD-10-CM | POA: Diagnosis not present

## 2018-01-15 DIAGNOSIS — S72001A Fracture of unspecified part of neck of right femur, initial encounter for closed fracture: Secondary | ICD-10-CM | POA: Diagnosis not present

## 2018-01-15 DIAGNOSIS — R2689 Other abnormalities of gait and mobility: Secondary | ICD-10-CM | POA: Diagnosis not present

## 2018-01-15 DIAGNOSIS — M6281 Muscle weakness (generalized): Secondary | ICD-10-CM | POA: Diagnosis not present

## 2018-01-18 DIAGNOSIS — R2689 Other abnormalities of gait and mobility: Secondary | ICD-10-CM | POA: Diagnosis not present

## 2018-01-18 DIAGNOSIS — M6281 Muscle weakness (generalized): Secondary | ICD-10-CM | POA: Diagnosis not present

## 2018-01-22 DIAGNOSIS — R2689 Other abnormalities of gait and mobility: Secondary | ICD-10-CM | POA: Diagnosis not present

## 2018-01-22 DIAGNOSIS — M6281 Muscle weakness (generalized): Secondary | ICD-10-CM | POA: Diagnosis not present

## 2018-01-25 DIAGNOSIS — R2689 Other abnormalities of gait and mobility: Secondary | ICD-10-CM | POA: Diagnosis not present

## 2018-01-25 DIAGNOSIS — M6281 Muscle weakness (generalized): Secondary | ICD-10-CM | POA: Diagnosis not present

## 2018-01-29 DIAGNOSIS — R2689 Other abnormalities of gait and mobility: Secondary | ICD-10-CM | POA: Diagnosis not present

## 2018-01-29 DIAGNOSIS — M6281 Muscle weakness (generalized): Secondary | ICD-10-CM | POA: Diagnosis not present

## 2018-02-01 DIAGNOSIS — R2689 Other abnormalities of gait and mobility: Secondary | ICD-10-CM | POA: Diagnosis not present

## 2018-02-01 DIAGNOSIS — M6281 Muscle weakness (generalized): Secondary | ICD-10-CM | POA: Diagnosis not present

## 2018-02-05 DIAGNOSIS — M6281 Muscle weakness (generalized): Secondary | ICD-10-CM | POA: Diagnosis not present

## 2018-02-05 DIAGNOSIS — R2689 Other abnormalities of gait and mobility: Secondary | ICD-10-CM | POA: Diagnosis not present

## 2018-02-06 DIAGNOSIS — M25551 Pain in right hip: Secondary | ICD-10-CM | POA: Diagnosis not present

## 2018-02-08 DIAGNOSIS — R2689 Other abnormalities of gait and mobility: Secondary | ICD-10-CM | POA: Diagnosis not present

## 2018-02-08 DIAGNOSIS — M6281 Muscle weakness (generalized): Secondary | ICD-10-CM | POA: Diagnosis not present

## 2018-02-26 DIAGNOSIS — M810 Age-related osteoporosis without current pathological fracture: Secondary | ICD-10-CM | POA: Diagnosis not present

## 2018-02-26 DIAGNOSIS — Z780 Asymptomatic menopausal state: Secondary | ICD-10-CM | POA: Diagnosis not present

## 2018-03-06 DIAGNOSIS — M25551 Pain in right hip: Secondary | ICD-10-CM | POA: Diagnosis not present

## 2018-04-17 DIAGNOSIS — M810 Age-related osteoporosis without current pathological fracture: Secondary | ICD-10-CM | POA: Diagnosis not present

## 2018-04-17 DIAGNOSIS — I10 Essential (primary) hypertension: Secondary | ICD-10-CM | POA: Diagnosis not present

## 2018-04-17 DIAGNOSIS — E46 Unspecified protein-calorie malnutrition: Secondary | ICD-10-CM | POA: Diagnosis not present

## 2019-03-17 DIAGNOSIS — I10 Essential (primary) hypertension: Secondary | ICD-10-CM | POA: Diagnosis not present

## 2019-03-17 DIAGNOSIS — E46 Unspecified protein-calorie malnutrition: Secondary | ICD-10-CM | POA: Diagnosis not present

## 2019-03-17 DIAGNOSIS — M810 Age-related osteoporosis without current pathological fracture: Secondary | ICD-10-CM | POA: Diagnosis not present

## 2019-03-17 DIAGNOSIS — D489 Neoplasm of uncertain behavior, unspecified: Secondary | ICD-10-CM | POA: Diagnosis not present

## 2020-08-28 ENCOUNTER — Emergency Department (HOSPITAL_COMMUNITY): Payer: Medicare Other

## 2020-08-28 ENCOUNTER — Encounter (HOSPITAL_COMMUNITY): Payer: Self-pay

## 2020-08-28 ENCOUNTER — Other Ambulatory Visit: Payer: Self-pay

## 2020-08-28 ENCOUNTER — Emergency Department (HOSPITAL_COMMUNITY)
Admission: EM | Admit: 2020-08-28 | Discharge: 2020-08-28 | Disposition: A | Discharge status: Home / Self Care | Payer: Medicare Other | Attending: Emergency Medicine | Admitting: Emergency Medicine

## 2020-08-28 DIAGNOSIS — S42202A Unspecified fracture of upper end of left humerus, initial encounter for closed fracture: Secondary | ICD-10-CM | POA: Diagnosis not present

## 2020-08-28 DIAGNOSIS — G9389 Other specified disorders of brain: Secondary | ICD-10-CM | POA: Diagnosis not present

## 2020-08-28 DIAGNOSIS — S42292A Other displaced fracture of upper end of left humerus, initial encounter for closed fracture: Secondary | ICD-10-CM | POA: Diagnosis not present

## 2020-08-28 DIAGNOSIS — I491 Atrial premature depolarization: Secondary | ICD-10-CM | POA: Diagnosis not present

## 2020-08-28 DIAGNOSIS — S42352A Displaced comminuted fracture of shaft of humerus, left arm, initial encounter for closed fracture: Secondary | ICD-10-CM | POA: Diagnosis not present

## 2020-08-28 DIAGNOSIS — J449 Chronic obstructive pulmonary disease, unspecified: Secondary | ICD-10-CM | POA: Insufficient documentation

## 2020-08-28 DIAGNOSIS — Z79899 Other long term (current) drug therapy: Secondary | ICD-10-CM | POA: Insufficient documentation

## 2020-08-28 DIAGNOSIS — R52 Pain, unspecified: Secondary | ICD-10-CM | POA: Diagnosis not present

## 2020-08-28 DIAGNOSIS — M25552 Pain in left hip: Secondary | ICD-10-CM | POA: Diagnosis not present

## 2020-08-28 DIAGNOSIS — F0390 Unspecified dementia without behavioral disturbance: Secondary | ICD-10-CM | POA: Diagnosis not present

## 2020-08-28 DIAGNOSIS — M4802 Spinal stenosis, cervical region: Secondary | ICD-10-CM | POA: Diagnosis not present

## 2020-08-28 DIAGNOSIS — W19XXXA Unspecified fall, initial encounter: Secondary | ICD-10-CM | POA: Insufficient documentation

## 2020-08-28 DIAGNOSIS — I10 Essential (primary) hypertension: Secondary | ICD-10-CM | POA: Diagnosis not present

## 2020-08-28 DIAGNOSIS — R0902 Hypoxemia: Secondary | ICD-10-CM | POA: Diagnosis not present

## 2020-08-28 DIAGNOSIS — S299XXA Unspecified injury of thorax, initial encounter: Secondary | ICD-10-CM | POA: Diagnosis not present

## 2020-08-28 DIAGNOSIS — S4992XA Unspecified injury of left shoulder and upper arm, initial encounter: Secondary | ICD-10-CM | POA: Diagnosis not present

## 2020-08-28 DIAGNOSIS — M7989 Other specified soft tissue disorders: Secondary | ICD-10-CM | POA: Diagnosis not present

## 2020-08-28 DIAGNOSIS — S0990XA Unspecified injury of head, initial encounter: Secondary | ICD-10-CM | POA: Diagnosis not present

## 2020-08-28 DIAGNOSIS — Z7982 Long term (current) use of aspirin: Secondary | ICD-10-CM | POA: Diagnosis not present

## 2020-08-28 DIAGNOSIS — M47812 Spondylosis without myelopathy or radiculopathy, cervical region: Secondary | ICD-10-CM | POA: Diagnosis not present

## 2020-08-28 LAB — URINALYSIS, ROUTINE W REFLEX MICROSCOPIC
Bacteria, UA: NONE SEEN
Bilirubin Urine: NEGATIVE
Glucose, UA: 50 mg/dL — AB
Ketones, ur: 20 mg/dL — AB
Leukocytes,Ua: NEGATIVE
Nitrite: NEGATIVE
Protein, ur: NEGATIVE mg/dL
Specific Gravity, Urine: 1.016 (ref 1.005–1.030)
pH: 7 (ref 5.0–8.0)

## 2020-08-28 LAB — CBC WITH DIFFERENTIAL/PLATELET
Abs Immature Granulocytes: 0.11 10*3/uL — ABNORMAL HIGH (ref 0.00–0.07)
Basophils Absolute: 0 10*3/uL (ref 0.0–0.1)
Basophils Relative: 0 %
Eosinophils Absolute: 0 10*3/uL (ref 0.0–0.5)
Eosinophils Relative: 0 %
HCT: 43.7 % (ref 36.0–46.0)
Hemoglobin: 14.7 g/dL (ref 12.0–15.0)
Immature Granulocytes: 1 %
Lymphocytes Relative: 2 %
Lymphs Abs: 0.3 10*3/uL — ABNORMAL LOW (ref 0.7–4.0)
MCH: 30.4 pg (ref 26.0–34.0)
MCHC: 33.6 g/dL (ref 30.0–36.0)
MCV: 90.3 fL (ref 80.0–100.0)
Monocytes Absolute: 0.4 10*3/uL (ref 0.1–1.0)
Monocytes Relative: 3 %
Neutro Abs: 13.6 10*3/uL — ABNORMAL HIGH (ref 1.7–7.7)
Neutrophils Relative %: 94 %
Platelets: 144 10*3/uL — ABNORMAL LOW (ref 150–400)
RBC: 4.84 MIL/uL (ref 3.87–5.11)
RDW: 12.8 % (ref 11.5–15.5)
WBC: 14.4 10*3/uL — ABNORMAL HIGH (ref 4.0–10.5)
nRBC: 0 % (ref 0.0–0.2)

## 2020-08-28 LAB — CK: Total CK: 154 U/L (ref 38–234)

## 2020-08-28 LAB — BASIC METABOLIC PANEL
Anion gap: 10 (ref 5–15)
BUN: 19 mg/dL (ref 8–23)
CO2: 28 mmol/L (ref 22–32)
Calcium: 9.1 mg/dL (ref 8.9–10.3)
Chloride: 100 mmol/L (ref 98–111)
Creatinine, Ser: 0.61 mg/dL (ref 0.44–1.00)
GFR, Estimated: >60 mL/min (ref >60)
Glucose, Bld: 147 mg/dL — ABNORMAL HIGH (ref 70–99)
Potassium: 3.7 mmol/L (ref 3.5–5.1)
Sodium: 138 mmol/L (ref 135–145)

## 2020-08-28 MED ORDER — OXYCODONE-ACETAMINOPHEN 5-325 MG PO TABS
1.0000 | ORAL_TABLET | Freq: Once | ORAL | Status: AC
  Administered 2020-08-28: 1 via ORAL
  Filled 2020-08-28: qty 1

## 2020-08-28 MED ORDER — OXYCODONE-ACETAMINOPHEN 5-325 MG PO TABS: 1.0000 | ORAL_TABLET | Freq: Four times a day (QID) | ORAL | 0 refills | Status: AC | PRN

## 2020-08-28 NOTE — ED Triage Notes (Signed)
EMS reports from home, lives with daughter who states unwitnessed fall this morning, found on ground next to couch, obvious injury to left shoulder and c/o left hip pain. Had hip replacement prior. Per daughter Pt has dementia.   BP 146/72 HR 82 RR 18 Sp02 96 RA  18ga RAC

## 2020-08-28 NOTE — Discharge Instructions (Addendum)
Erica Arnold was evaluated today in the emergency department after her fall.  Her vital signs were very reassuring.  She has broken her left humerus, which is the bone in your upper arm.  She is been placed in a shoulder immobilizer.  Below is the contact information for Dr. Mardelle Matte, the orthopedic surgeon.  Please call his office first thing this afternoon to schedule a follow-up appointment for early next week.  CT scans of her head and neck were negative for broken bones or dislocations, as well as negative for bleeding in her brain.  This is very good news.  X-rays of her ribs, hip, and pelvis were also negative for broken bones or dislocations. Her blood work and urine tests were very reassuring as well. There are no signs of infection at this time.   She may continue to take Tylenol at home as needed for pain. Additionally she has been prescribed a pain medication called percocet, which is a combination of tylenol and a narcotic pain medicine. She should not take additional tylenol at the times she is taking the percocet.   Return to the emergency department if she develops any chest pain, difficulty breathing, nausea or vomiting that doesn't stop, she becomes lethargic, if she develops numbness, tingling, weakness in her left hand, or any other new severe symptoms.

## 2020-08-28 NOTE — Progress Notes (Signed)
Orthopedic Tech Progress Note Patient Details:  Erica Arnold 22-Aug-1931 [id number]  Ortho Devices Ortho Device/Splint Location: applied shoulder immobilizer to LUE Ortho Device/Splint Interventions: Ordered,Application   Post Interventions Patient Tolerated: Well Instructions Provided: Care of device   Braulio Bosch 08/28/2020, 1:35 PM

## 2020-08-28 NOTE — ED Provider Notes (Signed)
Erica Arnold Provider Note   CSN: [id number] Arrival date & time: 08/28/20  [id number]     History Chief Complaint  Patient presents with  . Fall  . Shoulder Injury    Erica Arnold is a 87 y.o. female with hx of dementia who presents via EMS following unwitnessed fall at home, where she lives with her daughter.  Per EMS report patient was found on the ground next to the couch with obvious injury to her left shoulder, complaining of left hip pain.  She has history of prior hip replacement on the other side in 2018.  At time of my evaluation the patient appears worried, scared, sitting alone in her hospital room.  She is wearing a c-collar with obvious deformity to her left shoulder.  She complains of left shoulder plain, states that her left leg hurts, "but I do not know where".  Patient is obviously demented.  She keeps asking for family.  When asked about her fall she says "well I don't know much about it".  She is alert and interactive, though has a flat affect.  Collateral history obtained from her daughter via phone, Erica Arnold, whom the patient lives with, who states that due to patient's degenerative spine disease, she prefers to sleep on the couch.  She states that she walked past her mother sleeping on the couch around 3 AM, resting normally on the sofa.  However at 6 AM her husband walked to the living room and found the patient lying on the ground next to the couch. It is unclear how long the patient was down on the ground. [name] states that they suspect that she fell from either sitting or standing position, as her head was at the opposite end of the couch from where her pillow is.  Additionally the patient did not have her shoes on, which is abnormal, as the patient always puts her shoes on prior to standing to walk.  She states that her mom was oriented to person and place when they found her on the ground, however they were unable to get the patient  up off the floor due to her left shoulder deformity and pain.  EMS was called at that time.  Per patient's daughter, patient has a lot of anxiety around medical environments, her blood pressure usually is elevated when in this setting.  States her pressure is usually 150s/80s at home, she takes half tablet of metoprolol daily.  She states that the patient has been behaving normally for her the past several days.  I personally reviewed this patient's medical records.  She has history of hypertension, COPD, TIA, right hip fracture with subsequent replacement (2018).  Level 5 caveat due to patient's underlying dementia, inability to contribute to history.  HPI     Past Medical History:  Diagnosis Date  . COPD (chronic obstructive pulmonary disease) ([place])   . Hypertension   . Seasonal allergies   . TIA (transient ischemic attack)     Patient Active Problem List   Diagnosis Date Noted  . History of TIA (transient ischemic attack) 12/26/2017  . History of COPD 12/26/2017  . Thrombocytopenia ([place]) 12/26/2017  . Subcapital fracture of neck of right femur ([place]) 12/26/2017  . Essential hypertension 12/23/2008  . COPD 12/23/2008  . DYSPNEA 12/23/2008    Past Surgical History:  Procedure Laterality Date  . HIP ARTHROPLASTY Right 12/27/2017   Procedure: ARTHROPLASTY BIPOLAR HIP (HEMIARTHROPLASTY);  Surgeon: Marchia Bond, MD;  Location: [facility]  OR;  Service: Orthopedics;  Laterality: Right;  . MASS EXCISION Right 12/27/2017   Procedure: EXCISION  of subcutaneous mass;  Surgeon: Marchia Bond, MD;  Location: [facility];  Service: Orthopedics;  Laterality: Right;  . NO PAST SURGERIES       OB History   No obstetric history on file.     History reviewed. No pertinent family history.  Social History   Tobacco Use  . Smoking status: Never Smoker  . Smokeless tobacco: Never Used  Vaping Use  . Vaping Use: Never used  Substance Use Topics  . Alcohol use: No  . Drug use: No    Home  Medications Prior to Admission medications   Medication Sig Start Date End Date Taking? Authorizing Provider  acetaminophen (TYLENOL) 500 MG tablet Take 1,000 mg by mouth every 6 (six) hours as needed for mild pain, fever or headache.   Yes [provider]  aspirin 81 MG chewable tablet Chew 41.5 mg by mouth once a week. Sundays   Yes [provider]  hydrocortisone cream 1 % Apply 1 application topically 4 (four) times daily as needed for itching (dry spots on face).   Yes [provider]  metoprolol tartrate (LOPRESSOR) 25 MG tablet Take 12.5 mg by mouth daily.   Yes [provider]  oxyCODONE-acetaminophen (PERCOCET/ROXICET) 5-325 MG tablet Take 1 tablet by mouth every 6 (six) hours as needed for moderate pain or severe pain. 08/28/20  Yes Delmar Dondero R, PA-C  enoxaparin (LOVENOX) 30 MG/0.3ML injection Inject 0.3 mLs (30 mg total) into the skin daily. Patient not taking: Reported on 08/28/2020 12/27/17   Marchia Bond, MD    Allergies    Doxycycline, Tetanus toxoids, and Other  Review of Systems   Review of Systems  Unable to perform ROS: Dementia  Musculoskeletal:       Left shoulder pain, left leg pain    Physical Exam Updated Vital Signs BP (!) 167/83   Pulse 84   Temp 97.9 F (36.6 C) (Oral)   Resp 17   SpO2 95%   Physical Exam Vitals and nursing note reviewed.  Constitutional:      Interventions: Cervical collar in place.  HENT:     Head: Normocephalic and atraumatic.     Nose: Nose normal.     Mouth/Throat:     Mouth: Mucous membranes are moist.     Pharynx: Oropharynx is clear. Uvula midline. No oropharyngeal exudate or posterior oropharyngeal erythema.  Eyes:     General: Lids are normal. Vision grossly intact.        Right eye: No discharge.        Left eye: No discharge.     Extraocular Movements: Extraocular movements intact.     Conjunctiva/sclera: Conjunctivae normal.     Pupils: Pupils are equal, round, and  reactive to light.  Neck:     Trachea: Trachea and phonation normal.  Cardiovascular:     Rate and Rhythm: Normal rate and regular rhythm.     Pulses:          Radial pulses are 2+ on the right side and 2+ on the left side.       Dorsalis pedis pulses are 1+ on the right side and 2+ on the left side.     Heart sounds: Murmur heard.   Systolic murmur is present with a grade of 4/6.   Pulmonary:     Effort: Pulmonary effort is normal. No respiratory distress.  Breath sounds: Normal breath sounds. No wheezing or rales.  Chest:     Chest wall: No deformity, swelling, tenderness, crepitus or edema.  Abdominal:     General: Bowel sounds are normal. There is no distension.     Palpations: Abdomen is soft.     Tenderness: There is no abdominal tenderness. There is no guarding or rebound.  Musculoskeletal:     Right shoulder: Normal.     Left shoulder: Swelling, deformity and tenderness present. No crepitus.     Right upper arm: Normal.     Left upper arm: Tenderness present.     Right elbow: Normal.     Left elbow: Normal.     Right forearm: Normal.     Left forearm: Normal.     Right wrist: Normal. Normal pulse.     Left wrist: Normal. Normal pulse.     Right hand: Normal.     Left hand: Normal.       Arms:     Cervical back: Neck supple. No tenderness or crepitus. No spinous process tenderness or muscular tenderness.     Thoracic back: Normal. No bony tenderness.     Lumbar back: Normal. No bony tenderness.     Right hip: Normal.     Left hip: Tenderness present. No bony tenderness or crepitus.     Right upper leg: Normal.     Left upper leg: Normal.     Right knee: Normal.     Left knee: Normal.     Right lower leg: Normal. No edema.     Left lower leg: Normal. No edema.     Right ankle: Normal.     Right Achilles Tendon: Normal.     Left ankle: Normal.     Left Achilles Tendon: Normal.     Right foot: Normal.     Left foot: Normal.     Comments: Cervical collar in  place 4/5 grip strength bilaterally, symmetric. Normal cap refill in digits bilaterally, normal radial pulse bilaterally. 5/5 strength in plantar and dorsiflexion bilaterally. Patient able to range hip flexion, unable to follow command to range knees.   Lymphadenopathy:     Cervical: No cervical adenopathy.  Skin:    General: Skin is warm and dry.     Capillary Refill: Capillary refill takes less than 2 seconds.     Findings: No abrasion or wound.  Neurological:     General: No focal deficit present.     Mental Status: She is alert. Mental status is at baseline.     Sensory: Sensation is intact.     Motor: Motor function is intact.  Psychiatric:        Mood and Affect: Mood normal.     ED Results / Procedures / Treatments   Labs (all labs ordered are listed, but only abnormal results are displayed) Labs Reviewed  BASIC METABOLIC PANEL - Abnormal; Notable for the following components:      Result Value   Glucose, Bld 147 (*)    All other components within normal limits  CBC WITH DIFFERENTIAL/PLATELET - Abnormal; Notable for the following components:   WBC 14.4 (*)    Platelets 144 (*)    Neutro Abs 13.6 (*)    Lymphs Abs 0.3 (*)    Abs Immature Granulocytes 0.11 (*)    All other components within normal limits  URINALYSIS, ROUTINE W REFLEX MICROSCOPIC - Abnormal; Notable for the following components:   Glucose, UA 50 (*)  Hgb urine dipstick SMALL (*)    Ketones, ur 20 (*)    All other components within normal limits  CK    EKG EKG Interpretation  Date/Time:  Saturday August 28 2020 10:33:22 EST Ventricular Rate:  88 PR Interval:    QRS Duration: 75 QT Interval:  357 QTC Calculation: 432 R Axis:   66 Text Interpretation: Sinus rhythm Confirmed by Madalyn Rob [phone]) on 08/28/2020 11:26:41 AM   Radiology DG Ribs Unilateral W/Chest Left  Result Date: 08/28/2020 CLINICAL DATA:  EMS reports from home, lives with daughter who states unwitnessed fall this  morning, found on ground next to couch, obvious injury to left shoulder and c/o left hip pain. Had right hip replacement prior. Per daughter Pt has dementia. EXAM: LEFT RIBS AND CHEST - 3+ VIEW COMPARISON:  12/26/2017 FINDINGS: Displaced comminuted fracture of the proximal left humerus with surrounding soft tissue swelling. No dislocation. No rib fracture or rib lesion. Cardiac silhouette normal in size. No mediastinal or hilar masses. No acute findings in the lungs. IMPRESSION: 1. Comminuted, displaced fracture of the proximal left humerus. No dislocation. 2. No rib fracture or rib lesion. 3. No acute cardiopulmonary disease. Electronically Signed   By: Lajean Manes M.D.   On: 08/28/2020 10:43   CT Head Wo Contrast  Result Date: 08/28/2020 CLINICAL DATA:  Unwitnessed fall this morning.  Dementia. EXAM: CT HEAD WITHOUT CONTRAST CT CERVICAL SPINE WITHOUT CONTRAST TECHNIQUE: Multidetector CT imaging of the head and cervical spine was performed following the standard protocol without intravenous contrast. Multiplanar CT image reconstructions of the cervical spine were also generated. COMPARISON:  Head CT 08/02/2005 FINDINGS: CT HEAD FINDINGS Brain: Ventricles, cisterns and other CSF spaces are normal. There is moderate chronic ischemic microvascular disease present. There is no mass, mass effect, shift of midline structures or acute hemorrhage. No evidence of acute infarction. Vascular: No hyperdense vessel or unexpected calcification. Skull: Normal. Negative for fracture or focal lesion. Sinuses/Orbits: Orbits are normal symmetric. There is minimal focal opacification over the right posterior ethmoid air cells. Other: None. CT CERVICAL SPINE FINDINGS Alignment: Normal. Skull base and vertebrae: Vertebral body heights are normal. There is mild spondylosis throughout the cervical spine. There is no acute fracture or traumatic subluxation. Mild right-sided neural foraminal narrowing at the C4-5 level and moderate  right-sided neural foraminal narrowing at the C5-6 level. There is mild uncovertebral joint spurring and facet arthropathy. Soft tissues and spinal canal: No prevertebral fluid or swelling. No visible canal hematoma. Disc levels:  Moderate disc space narrowing at the C6-7 level. Upper chest: No acute findings. Other: None. IMPRESSION: 1. No acute brain injury. 2. Moderate chronic ischemic microvascular disease. 3. No acute cervical spine injury. 4. Mild spondylosis of the cervical spine with disc disease at the C6-7 level. Mild right-sided neural foraminal narrowing at the C4-5 and C5-6 levels. Electronically Signed   By: Marin Olp M.D.   On: 08/28/2020 10:24   CT Cervical Spine Wo Contrast  Result Date: 08/28/2020 CLINICAL DATA:  Unwitnessed fall this morning.  Dementia. EXAM: CT HEAD WITHOUT CONTRAST CT CERVICAL SPINE WITHOUT CONTRAST TECHNIQUE: Multidetector CT imaging of the head and cervical spine was performed following the standard protocol without intravenous contrast. Multiplanar CT image reconstructions of the cervical spine were also generated. COMPARISON:  Head CT 08/02/2005 FINDINGS: CT HEAD FINDINGS Brain: Ventricles, cisterns and other CSF spaces are normal. There is moderate chronic ischemic microvascular disease present. There is no mass, mass effect, shift of midline structures or  acute hemorrhage. No evidence of acute infarction. Vascular: No hyperdense vessel or unexpected calcification. Skull: Normal. Negative for fracture or focal lesion. Sinuses/Orbits: Orbits are normal symmetric. There is minimal focal opacification over the right posterior ethmoid air cells. Other: None. CT CERVICAL SPINE FINDINGS Alignment: Normal. Skull base and vertebrae: Vertebral body heights are normal. There is mild spondylosis throughout the cervical spine. There is no acute fracture or traumatic subluxation. Mild right-sided neural foraminal narrowing at the C4-5 level and moderate right-sided neural  foraminal narrowing at the C5-6 level. There is mild uncovertebral joint spurring and facet arthropathy. Soft tissues and spinal canal: No prevertebral fluid or swelling. No visible canal hematoma. Disc levels:  Moderate disc space narrowing at the C6-7 level. Upper chest: No acute findings. Other: None. IMPRESSION: 1. No acute brain injury. 2. Moderate chronic ischemic microvascular disease. 3. No acute cervical spine injury. 4. Mild spondylosis of the cervical spine with disc disease at the C6-7 level. Mild right-sided neural foraminal narrowing at the C4-5 and C5-6 levels. Electronically Signed   By: Elberta Fortis M.D.   On: 08/28/2020 10:24   DG Shoulder Left  Result Date: 08/28/2020 CLINICAL DATA:  EMS reports from home, lives with daughter who states unwitnessed fall this morning, found on ground next to couch, obvious injury to left shoulder and c/o left hip pain. Had right hip replacement prior. Per daughter Pt has dementia. EXAM: LEFT SHOULDER - 2+ VIEW COMPARISON:  None. FINDINGS: Comminuted fracture of the proximal left humerus. There is a transverse fracture across the surgical neck. Additional fractures are noted of the greater tuberosity. Shaft fracture component has displaced superiorly, by approximately 2 cm, lying along the anterior margin of the humeral head component. Humeral head remains normally aligned with the glenoid. There is diffuse surrounding soft tissue swelling. IMPRESSION: 1. Comminuted, displaced fracture of the proximal left humerus. No dislocation. Electronically Signed   By: Amie Portland M.D.   On: 08/28/2020 10:44   DG Humerus Left  Result Date: 08/28/2020 CLINICAL DATA:  EMS reports from home, lives with daughter who states unwitnessed fall this morning, found on ground next to couch, obvious injury to left shoulder and c/o left hip pain. Had right hip replacement prior. Per daughter Pt has dementia. EXAM: LEFT HUMERUS - 2+ VIEW COMPARISON:  None. FINDINGS: Comminuted,  displaced fracture of the proximal left humerus as described under the left shoulder radiographs. Surrounding soft tissue swelling. No other fractures. Glenohumeral and elbow joints appear normally aligned. No bone lesion. IMPRESSION: 1. Proximal humeral fracture detailed under the left shoulder radiographs. 2. No other fractures and no dislocation. Electronically Signed   By: Amie Portland M.D.   On: 08/28/2020 10:45   DG Hip Unilat W or Wo Pelvis 2-3 Views Left  Result Date: 08/28/2020 CLINICAL DATA:  EMS reports from home, lives with daughter who states unwitnessed fall this morning, found on ground next to couch, obvious injury to left shoulder and c/o left hip pain. Had right hip replacement prior. Per daughter Pt has dementia. EXAM: DG HIP (WITH OR WITHOUT PELVIS) 2-3V LEFT COMPARISON:  None. FINDINGS: No fracture. No bone lesion. Skeletal structures are diffusely demineralized. Medial left hip joint space narrowing. Mild marginal osteophytes from the lateral superior left acetabulum. Right hip hemiarthroplasty appears well seated and aligned. SI joints and symphysis pubis are normally aligned. Soft tissues demonstrate arterial vascular calcifications but are otherwise unremarkable. IMPRESSION: 1. No fracture or dislocation. Electronically Signed   By: Renard Hamper.D.  On: 08/28/2020 10:47    Procedures Procedures (including critical care time)  Medications Ordered in ED Medications - No data to display  ED Course  I have reviewed the triage vital signs and the nursing notes.  Pertinent labs & imaging results that were available during my care of the patient were reviewed by me and considered in my medical decision making (see chart for details).    MDM Rules/Calculators/A&P                         87 year old female with dementia presents via EMS after unwitnessed fall at home.  Obvious deformity of the left shoulder, complaining of left leg pain.  Patient hypertensive on intake to  170/85, vital signs otherwise normal.  Physical exam significant for obvious left shoulder deformity; left arm in sling.  Tenderness palpation over the left shoulder and left humerus.  Symmetric strong radial pulses bilaterally.  Tenderness palpation over the left hip and proximal femur.  Tenderness over the left ribs.  We will proceed with CT head and cervical spine, and plain films of the left shoulder, humerus, ribs, and hip.  Patient declining analgesia at this time.  Additionally we will proceed with basic laboratory studies and EKG given unwitnessed nature of the fall.  CT head without acute intracranial abnormality, CT cervical spine without acute fracture or dislocation. Rib films negative for acute fracture, chest x-ray negative for acute cardiopulmonary disease.  Plain film of the left hip also negative for acute fracture or dislocation.  Plain films of the left shoulder and humerus with comminuted displaced (2 cm superiorly) fracture of the proximal left humerus; no humeral head dislocation.   CBC with leukocytosis of 14.4, BMP unremarkable.  CK normal at 154.  EKG with sinus rhythm , UA without signs of infection.  Patient also seen by attending at the bedside. Given reassuring vital signs, laboratory studies, EKG, and imaging no further work-up is warranted in the emergency department this time.  Appears patient's only injury is her acute proximal humeral fracture.  Will place patient in shoulder immobilizer with instructions to follow-up closely with orthopedics early next week. Will d/c with Rx for analgesia.  Ladiamond's daughter, [name], who is at the bedside voiced understanding of her medical evaluation and treatment plan.  Each of her questions were answered to expressed satisfaction.  [name] is patient's full-time caretaker.  Strict return precautions were given.  Patient is stable and appropriate for discharge at this time.  This chart was dictated using voice recognition software, Dragon.  Despite the best efforts of this provider to proofread and correct errors, errors may still occur which can change documentation meaning.  Final Clinical Impression(s) / ED Diagnoses Final diagnoses:  Other closed displaced fracture of proximal end of left humerus, initial encounter    Rx / DC Orders ED Discharge Orders         Ordered    oxyCODONE-acetaminophen (PERCOCET/ROXICET) 5-325 MG tablet  Every 6 hours PRN        08/28/20 1311           Philipe Laswell, Gypsy Balsam, PA-C 08/28/20 1318    Lucrezia Starch, MD 08/29/20 [phone]

## 2020-08-30 DIAGNOSIS — S42292A Other displaced fracture of upper end of left humerus, initial encounter for closed fracture: Secondary | ICD-10-CM | POA: Diagnosis not present

## 2020-08-31 ENCOUNTER — Other Ambulatory Visit: Payer: Self-pay

## 2020-08-31 ENCOUNTER — Other Ambulatory Visit: Payer: Self-pay | Admitting: Orthopedic Surgery

## 2020-08-31 ENCOUNTER — Ambulatory Visit
Admission: RE | Admit: 2020-08-31 | Discharge: 2020-08-31 | Disposition: A | Discharge status: Home / Self Care | Payer: Medicare Other | Source: Ambulatory Visit | Attending: Orthopedic Surgery | Admitting: Orthopedic Surgery

## 2020-08-31 DIAGNOSIS — M25512 Pain in left shoulder: Secondary | ICD-10-CM

## 2020-08-31 DIAGNOSIS — S42295A Other nondisplaced fracture of upper end of left humerus, initial encounter for closed fracture: Secondary | ICD-10-CM | POA: Diagnosis not present

## 2020-08-31 DIAGNOSIS — S4292XA Fracture of left shoulder girdle, part unspecified, initial encounter for closed fracture: Secondary | ICD-10-CM | POA: Diagnosis not present

## 2020-08-31 DIAGNOSIS — S42212A Unspecified displaced fracture of surgical neck of left humerus, initial encounter for closed fracture: Secondary | ICD-10-CM | POA: Diagnosis not present

## 2020-08-31 DIAGNOSIS — S42252A Displaced fracture of greater tuberosity of left humerus, initial encounter for closed fracture: Secondary | ICD-10-CM | POA: Diagnosis not present

## 2020-08-31 DIAGNOSIS — M25511 Pain in right shoulder: Secondary | ICD-10-CM

## 2020-09-08 DIAGNOSIS — S42292D Other displaced fracture of upper end of left humerus, subsequent encounter for fracture with routine healing: Secondary | ICD-10-CM | POA: Diagnosis not present

## 2020-10-06 DIAGNOSIS — S42292D Other displaced fracture of upper end of left humerus, subsequent encounter for fracture with routine healing: Secondary | ICD-10-CM | POA: Diagnosis not present

## 2020-11-18 DIAGNOSIS — I10 Essential (primary) hypertension: Secondary | ICD-10-CM | POA: Diagnosis not present

## 2020-11-18 DIAGNOSIS — N3000 Acute cystitis without hematuria: Secondary | ICD-10-CM | POA: Diagnosis not present

## 2020-11-18 DIAGNOSIS — R609 Edema, unspecified: Secondary | ICD-10-CM | POA: Diagnosis not present

## 2020-11-18 DIAGNOSIS — Z8673 Personal history of transient ischemic attack (TIA), and cerebral infarction without residual deficits: Secondary | ICD-10-CM | POA: Diagnosis not present

## 2020-11-18 DIAGNOSIS — R011 Cardiac murmur, unspecified: Secondary | ICD-10-CM | POA: Diagnosis not present

## 2020-12-01 DIAGNOSIS — R011 Cardiac murmur, unspecified: Secondary | ICD-10-CM | POA: Diagnosis not present

## 2020-12-01 DIAGNOSIS — N3000 Acute cystitis without hematuria: Secondary | ICD-10-CM | POA: Diagnosis not present

## 2021-02-02 DIAGNOSIS — U071 COVID-19: Secondary | ICD-10-CM | POA: Diagnosis not present

## 2021-02-17 DIAGNOSIS — R609 Edema, unspecified: Secondary | ICD-10-CM | POA: Diagnosis not present

## 2021-02-17 DIAGNOSIS — R8290 Unspecified abnormal findings in urine: Secondary | ICD-10-CM | POA: Diagnosis not present

## 2021-02-17 DIAGNOSIS — I350 Nonrheumatic aortic (valve) stenosis: Secondary | ICD-10-CM | POA: Diagnosis not present

## 2021-03-18 ENCOUNTER — Emergency Department (HOSPITAL_COMMUNITY): Payer: Medicare Other

## 2021-03-18 ENCOUNTER — Emergency Department (HOSPITAL_COMMUNITY)
Admission: EM | Admit: 2021-03-18 | Discharge: 2021-04-14 | Disposition: E | Payer: Medicare Other | Attending: Emergency Medicine | Admitting: Emergency Medicine

## 2021-03-18 ENCOUNTER — Encounter (HOSPITAL_COMMUNITY): Payer: Self-pay

## 2021-03-18 DIAGNOSIS — I213 ST elevation (STEMI) myocardial infarction of unspecified site: Secondary | ICD-10-CM

## 2021-03-18 DIAGNOSIS — J449 Chronic obstructive pulmonary disease, unspecified: Secondary | ICD-10-CM | POA: Insufficient documentation

## 2021-03-18 DIAGNOSIS — R0602 Shortness of breath: Secondary | ICD-10-CM | POA: Diagnosis not present

## 2021-03-18 DIAGNOSIS — I10 Essential (primary) hypertension: Secondary | ICD-10-CM | POA: Insufficient documentation

## 2021-03-18 DIAGNOSIS — Z7982 Long term (current) use of aspirin: Secondary | ICD-10-CM | POA: Diagnosis not present

## 2021-03-18 DIAGNOSIS — R0603 Acute respiratory distress: Secondary | ICD-10-CM | POA: Diagnosis not present

## 2021-03-18 DIAGNOSIS — Z96641 Presence of right artificial hip joint: Secondary | ICD-10-CM | POA: Insufficient documentation

## 2021-03-18 DIAGNOSIS — Z515 Encounter for palliative care: Secondary | ICD-10-CM | POA: Diagnosis not present

## 2021-03-18 DIAGNOSIS — J80 Acute respiratory distress syndrome: Secondary | ICD-10-CM | POA: Diagnosis not present

## 2021-03-18 DIAGNOSIS — Z79899 Other long term (current) drug therapy: Secondary | ICD-10-CM | POA: Insufficient documentation

## 2021-03-18 DIAGNOSIS — I499 Cardiac arrhythmia, unspecified: Secondary | ICD-10-CM | POA: Diagnosis not present

## 2021-03-18 DIAGNOSIS — R739 Hyperglycemia, unspecified: Secondary | ICD-10-CM | POA: Diagnosis not present

## 2021-03-18 SURGERY — LEFT HEART CATH AND CORONARY ANGIOGRAPHY
Anesthesia: LOCAL

## 2021-03-18 MED ORDER — MORPHINE SULFATE (PF) 4 MG/ML IV SOLN
4.0000 mg | Freq: Once | INTRAVENOUS | Status: AC
  Administered 2021-03-18: 4 mg via INTRAVENOUS

## 2021-03-18 MED ORDER — MORPHINE SULFATE (PF) 2 MG/ML IV SOLN
2.0000 mg | INTRAVENOUS | Status: DC | PRN
  Administered 2021-03-18: 2 mg via INTRAVENOUS
  Filled 2021-03-18: qty 1

## 2021-03-18 MED ORDER — MORPHINE SULFATE (PF) 4 MG/ML IV SOLN
INTRAVENOUS | Status: AC
  Filled 2021-03-18: qty 1

## 2021-03-18 MED ORDER — SODIUM CHLORIDE 0.9 % IV BOLUS
1000.0000 mL | Freq: Once | INTRAVENOUS | Status: AC
  Administered 2021-03-18: 1000 mL via INTRAVENOUS

## 2021-04-14 NOTE — ED Notes (Signed)
Pt placed on bipap  

## 2021-04-14 NOTE — Consult Note (Signed)
   Erica Arnold initially presented to the ED as a code STEMI. She was in respiratory distress on arrival and unable to participate in history. Her daughter, Erica Arnold was present and able to contribute. Per Erica Canterbury, Erica Arnold was in her usual state of health this morning. She is able to ambulate around the house and converse normally at baseline. She ate lunch as usual and went to lay down which is not out of the ordinary for her. Shortly after laying down, Erica Arnold called to her daughter that she needed to vomit. She then had the sensation of needing to have a BM. She laid down to rest and again vomited. Erica Arnold stated that her mother did not have any complaints of chest pain, though was experiencing SOB and clamminess. Per Erica Arnold, Erica Arnold did not want her to call EMS, however given the severity of her symptoms EMS was activated. Initial EKG en route to the hospital was c/f inferior STE prompting STEMI activation. She was hypotensive with SBP in the 80s and tachypneic and placed on a NRB en route.   Patient was in acute respiratory distress on arrival. Several attempts to gain IV access have led to blown veins. Unlikely she would tolerate IV heparin even for conservative management of MI. Erica Arnold stated that her mother would not want advanced measures to prolong her life. She also stated that her mother would not want any invasive testing. As a result, code STEMI was cancelled. Palliative care has been consulted. Patient was placed on BiPAP and IV morphine administered. Patient moving towards comfort measures.   [name], PA-C 2021-03-25; 5:30 PM

## 2021-04-14 NOTE — ED Notes (Addendum)
1802 time of death, pronounced by Dr Jeanell Sparrow

## 2021-04-14 NOTE — Progress Notes (Signed)
   03-31-21 1800  Clinical Encounter Type  Visited With Patient and family together  Visit Type Death  Referral From Nurse  Consult/Referral To Chaplain  Spiritual Encounters  Spiritual Needs Grief support;Emotional;Prayer  The chaplain responded to page for patient death. The patient was surrounded by her daughter, son, and grandchildren. The chaplain facilitated a mini remembrance ceremony. The family offered words that described the patient. The family requested prayer. The chaplain prayed for comfort and peace. The chaplain gave the family a patient placement card. The family will use [facility] in [place]

## 2021-04-14 NOTE — ED Notes (Signed)
Per Dr Jeanell Sparrow, okay to turn off monitor for comfort care only.

## 2021-04-14 NOTE — ED Notes (Signed)
Thready pulse detected by Dr Jeanell Sparrow

## 2021-04-14 NOTE — ED Notes (Signed)
XR at bedside

## 2021-04-14 NOTE — ED Provider Notes (Addendum)
[facility] EMERGENCY DEPARTMENT Provider Note   CSN: [id number] Arrival date & time: 04/05/2021  1643     History Chief Complaint  Patient presents with   Shortness of Breath    Erica Arnold is a 87 y.o. female.  HPI 87 year old female presents as a code STEMI.  EMS called after patient began having nausea vomiting, doubled over and was clammy per her daughter.  EKG obtained prehospital had question of ST elevation in inferior leads.  STEMI was activated prehospital.  EMS reports that patient was hypotensive with systolic blood pressure approximately 80.  She received prehospital normal saline bolus 500 cc.  She has been dyspneic and required nonrebreather and was then given a neb in route.  She has been to speak which was questionably secondary to her dyspnea.  EMS reports prehospital CBG is in the 200s.     Past Medical History:  Diagnosis Date   COPD (chronic obstructive pulmonary disease) ([place])    Hypertension    Seasonal allergies    TIA (transient ischemic attack)     Patient Active Problem List   Diagnosis Date Noted   History of TIA (transient ischemic attack) 12/26/2017   History of COPD 12/26/2017   Thrombocytopenia ([place]) 12/26/2017   Subcapital fracture of neck of right femur ([place]) 12/26/2017   Essential hypertension 12/23/2008   COPD 12/23/2008   DYSPNEA 12/23/2008    Past Surgical History:  Procedure Laterality Date   HIP ARTHROPLASTY Right 12/27/2017   Procedure: ARTHROPLASTY BIPOLAR HIP (HEMIARTHROPLASTY);  Surgeon: Marchia Bond, MD;  Location: [facility];  Service: Orthopedics;  Laterality: Right;   MASS EXCISION Right 12/27/2017   Procedure: EXCISION  of subcutaneous mass;  Surgeon: Marchia Bond, MD;  Location: [place];  Service: Orthopedics;  Laterality: Right;   NO PAST SURGERIES       OB History   No obstetric history on file.     History reviewed. No pertinent family history.  Social History   Tobacco Use   Smoking status:  Never   Smokeless tobacco: Never  Vaping Use   Vaping Use: Never used  Substance Use Topics   Alcohol use: No   Drug use: No    Home Medications Prior to Admission medications   Medication Sig Start Date End Date Taking? Authorizing Provider  acetaminophen (TYLENOL) 500 MG tablet Take 1,000 mg by mouth every 6 (six) hours as needed for mild pain, fever or headache.    [provider]  aspirin 81 MG chewable tablet Chew 41.5 mg by mouth once a week. Sundays    [provider]  enoxaparin (LOVENOX) 30 MG/0.3ML injection Inject 0.3 mLs (30 mg total) into the skin daily. Patient not taking: Reported on 08/28/2020 12/27/17   Marchia Bond, MD  hydrocortisone cream 1 % Apply 1 application topically 4 (four) times daily as needed for itching (dry spots on face).    [provider]  metoprolol tartrate (LOPRESSOR) 25 MG tablet Take 12.5 mg by mouth daily.    [provider]  oxyCODONE-acetaminophen (PERCOCET/ROXICET) 5-325 MG tablet Take 1 tablet by mouth every 6 (six) hours as needed for moderate pain or severe pain. 08/28/20   Sponseller, Gypsy Balsam, PA-C    Allergies    Doxycycline, Tetanus toxoids, and Other  Review of Systems   Review of Systems  Unable to perform ROS: Acuity of condition   Physical Exam Updated Vital Signs BP 108/62   Pulse 79   Temp (!) 95.7 F (  35.4 C) (Temporal)   Resp (!) 28   Ht 1.626 m ('5\' 4"'$ )   Wt 44.5 kg   SpO2 100%   BMI 16.82 kg/m   Physical Exam Vitals and nursing note reviewed.  Constitutional:      General: She is in acute distress.     Appearance: She is ill-appearing.  HENT:     Head: Normocephalic.  Eyes:     Pupils: Pupils are equal, round, and reactive to light.  Cardiovascular:     Rate and Rhythm: Normal rate. Rhythm irregular.  Pulmonary:     Effort: Tachypnea and respiratory distress present.     Breath sounds: Decreased breath sounds present.  Abdominal:     Palpations: Abdomen is soft.   Musculoskeletal:     Cervical back: Normal range of motion.     Right lower leg: No edema.     Left lower leg: No edema.     Comments: Multiple contusions and swelling at sites were IVs were attempted to be started  Skin:    Coloration: Skin is cyanotic.  Neurological:     Comments: Patient rolled to right side with eyes open she does not squeeze my hand to respond but does not appear to be flaccid and appears at 10 throughout    ED Results / Procedures / Treatments   Labs (all labs ordered are listed, but only abnormal results are displayed) Labs Reviewed - No data to display  EKG EKG Interpretation  Date/Time:  2021-04-10 16:44:39 EDT Ventricular Rate:  61 PR Interval:    QRS Duration: 85 QT Interval:  376 QTC Calculation: 379 R Axis:   99 Text Interpretation: Atrial fibrillation Inferior infarct, acute (RCA) Probable RV involvement, suggest recording right precordial leads >>> Acute MI <<< Confirmed by Pattricia Boss [phone]) on April 10, 2021 5:06:34 PM  Radiology No results found.  Procedures Procedures   Medications Ordered in ED Medications  morphine 4 MG/ML injection (  Not Given 04-10-21 1703)  sodium chloride 0.9 % bolus 1,000 mL (1,000 mLs Intravenous New Bag/Given 04-10-2021 1649)  morphine 4 MG/ML injection 4 mg (4 mg Intravenous Given April 10, 2021 1657)    ED Course  I have reviewed the triage vital signs and the nursing notes.  Pertinent labs & imaging results that were available during my care of the patient were reviewed by me and considered in my medical decision making (see chart for details).  Clinical Course as of 2021-04-10 1806  2021-04-10 DG Chest Portable 1 View [DR]    Clinical Course User Index [DR] Pattricia Boss, MD   MDM Rules/Calculators/A&P                          Patient assessed with Dr. Ellyn Hack, on call for cardiology.  Daughter states that mother would not want intubation or acute intervention.  Code STEMI canceled per Dr.  Ellyn Hack Given vascular fragility that has been noted with IV attempts, Dr. Ellyn Hack does not think that heparin should be administered. Left EJ is obtained for unable to draw blood back through. Discussion with daughter, who also consulted with son is for comfort care only. Systolic blood pressure increased to 125.  Patient was started on BiPAP for comfort. Morphine 4 mg is administered IV Discussed with Joeseph Amor, on-call for palliative care.  They will see and assess. Hospitalist consulted for admission 6:06 PM Patient has bradycardia and ceased respiratory effort.  Family is  at bedside.  Time of death declared at [place] Patient's primary care doctor is Dr. Donnie Coffin.  Final Clinical Impression(s) / ED Diagnoses Final diagnoses:  ST elevation myocardial infarction (STEMI), unspecified artery [facility])  Respiratory distress  Admission for palliative care    Rx / DC Orders ED Discharge Orders     None        Pattricia Boss, MD 03-27-21 1714    Pattricia Boss, MD 27-Mar-2021 1807

## 2021-04-14 NOTE — Progress Notes (Signed)
Patient ID: [name], female   DOB: 02/04/1932, 87 y.o.   MRN: [id number]       Thank you for this consult. Chart reviewed. Arrived outside of ED trauma room B at 18:04 and was informed by bedside RN that patient had just passed.     Elie Confer, NP-C Palliative Medicine   Please call Palliative Medicine team phone with any questions [phone]. For individual providers please see AMION.   No charge

## 2021-04-14 NOTE — ED Triage Notes (Signed)
Pt BIBA from home. Pt was normal 2 hours ago, ate with family. Approx 1 hr 15 min ago, pt had sudden onset of N/V, weakness. Pt then had SHOB. Upon EMS arrival, pt was hunched over, no radial or brachial pulses. Pt has ? COPD . Pt is wheezing and crackles on arrival.   Received albuterol, duoneb, 500 NS with EMS

## 2021-04-14 DEATH — deceased

## 2021-09-12 IMAGING — DX DG CHEST 1V PORT
1 series · 1 of 1 positions shown · non-contrast
Comparison: 08/28/2020

CLINICAL DATA: Shortness of breath

EXAM:
PORTABLE CHEST 1 VIEW

[chest]
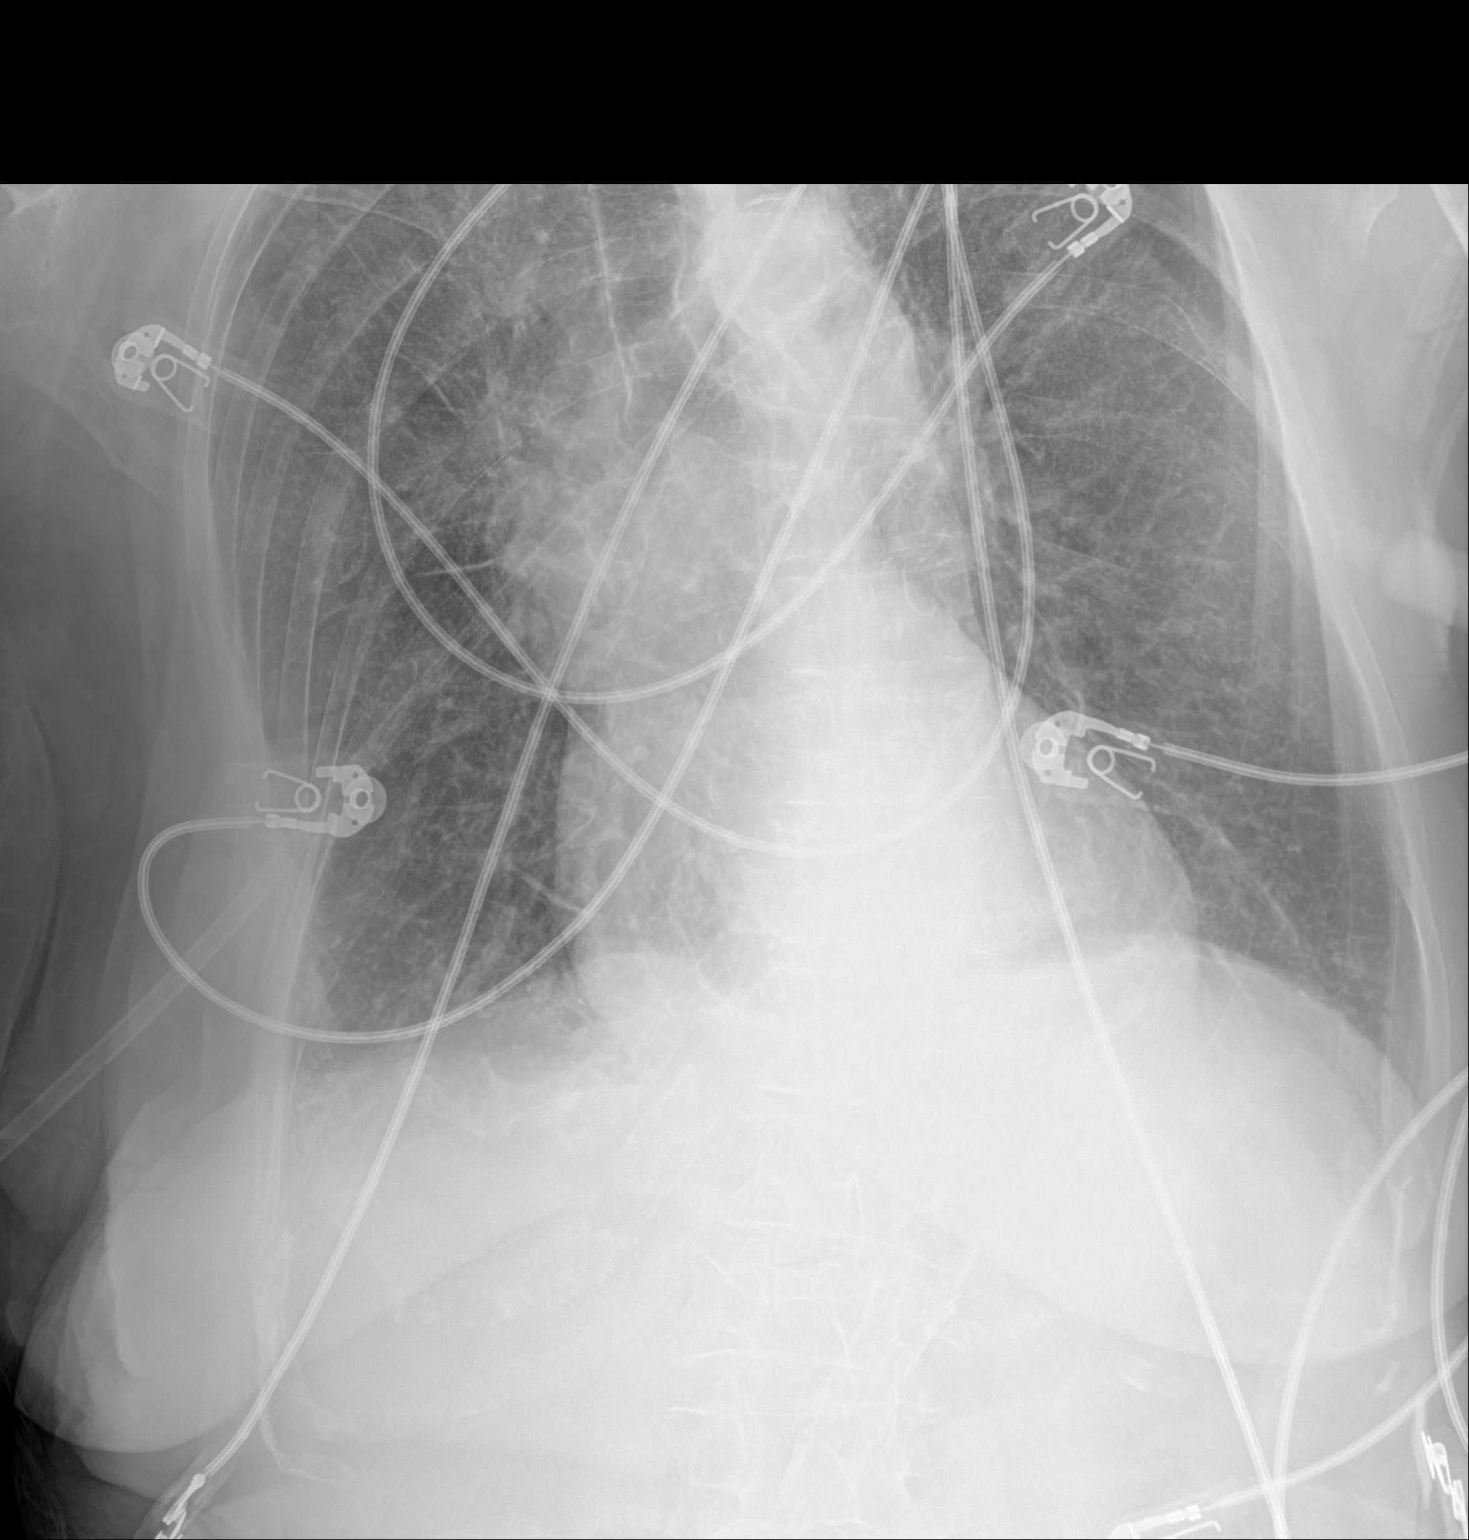

[1 of 1 positions shown; findings below may reference images not displayed]

FINDINGS: Heart is normal size. Aortic atherosclerosis. No confluent airspace
opacities or effusions. No acute bony abnormality.
IMPRESSION: No active disease.
